# Patient Record
Sex: Male | Born: 1960 | Race: White | Hispanic: No | Smoking: Never smoker
Health system: Southern US, Community
[De-identification: ages and names within clinical notes are randomized; demographics above are authoritative.]

## PROBLEM LIST (undated history)

## (undated) DIAGNOSIS — I480 Paroxysmal atrial fibrillation: Secondary | ICD-10-CM

## (undated) DIAGNOSIS — I10 Essential (primary) hypertension: Secondary | ICD-10-CM

## (undated) DIAGNOSIS — E669 Obesity, unspecified: Secondary | ICD-10-CM

## (undated) DIAGNOSIS — N529 Male erectile dysfunction, unspecified: Secondary | ICD-10-CM

## (undated) DIAGNOSIS — B351 Tinea unguium: Secondary | ICD-10-CM

## (undated) DIAGNOSIS — G4733 Obstructive sleep apnea (adult) (pediatric): Secondary | ICD-10-CM

## (undated) DIAGNOSIS — R7303 Prediabetes: Secondary | ICD-10-CM

## (undated) DIAGNOSIS — D509 Iron deficiency anemia, unspecified: Secondary | ICD-10-CM

## (undated) DIAGNOSIS — K259 Gastric ulcer, unspecified as acute or chronic, without hemorrhage or perforation: Secondary | ICD-10-CM

## (undated) HISTORY — DX: Prediabetes: R73.03

## (undated) HISTORY — DX: Iron deficiency anemia, unspecified: D50.9

## (undated) HISTORY — DX: Tinea unguium: B35.1

## (undated) HISTORY — DX: Hemochromatosis, unspecified: E83.119

## (undated) HISTORY — DX: Male erectile dysfunction, unspecified: N52.9

## (undated) HISTORY — DX: Obstructive sleep apnea (adult) (pediatric): G47.33

---

## 2008-11-24 ENCOUNTER — Emergency Department (HOSPITAL_COMMUNITY): Admission: EM | Admit: 2008-11-24 | Discharge: 2008-11-24 | Payer: Self-pay | Admitting: Emergency Medicine

## 2009-02-20 ENCOUNTER — Ambulatory Visit (HOSPITAL_BASED_OUTPATIENT_CLINIC_OR_DEPARTMENT_OTHER): Admission: RE | Admit: 2009-02-20 | Discharge: 2009-02-21 | Payer: Self-pay | Admitting: Orthopedic Surgery

## 2010-08-29 HISTORY — PX: OTHER SURGICAL HISTORY: SHX169

## 2010-12-06 LAB — BASIC METABOLIC PANEL
CO2: 25 mEq/L (ref 19–32)
Calcium: 8.9 mg/dL (ref 8.4–10.5)
Chloride: 110 mEq/L (ref 96–112)
GFR calc non Af Amer: >60 mL/min (ref >60)
Glucose, Bld: 88 mg/dL (ref 70–99)
Potassium: 4.2 mEq/L (ref 3.5–5.1)

## 2010-12-06 LAB — POCT HEMOGLOBIN-HEMACUE: Hemoglobin: 18.5 g/dL — ABNORMAL HIGH (ref 13.0–17.0)

## 2011-01-11 NOTE — Op Note (Signed)
NAME:  Darren Alexander, Darren Alexander NO.:  [id number]   MEDICAL RECORD NO.:  [id number]          PATIENT TYPE:  AMB   LOCATION:  DSC                          FACILITY:  [facility]   PHYSICIAN:  Eulas Post, MD    DATE OF BIRTH:  05-Feb-1961   DATE OF PROCEDURE:  02/20/2009  DATE OF DISCHARGE:                               OPERATIVE REPORT   ATTENDING SURGEON:  Eulas Post, MD   FIRST ASSISTANT:  Skip Mayer, PA-C   PREOPERATIVE DIAGNOSIS:  Left midshaft humerus nonunion.   POSTOPERATIVE DIAGNOSIS:  Left midshaft humerus nonunion.   OPERATIVE PROCEDURE:  Open reduction and internal fixation, left  midshaft humerus.   ANESTHESIA:  General and also a regional block.   ESTIMATED BLOOD LOSS:  300 mL.   OPERATIVE IMPLANTS:  Synthes narrow 4.5 mm locking plate with a total of  two proximal locking screws, one distal locking screw, one proximal  nonlocking screw, and two distal nonlocking screws.  The screws were  size 4.0 mm.   PREOPERATIVE INDICATIONS:  Darren Alexander is a 50 year old man who  had a midshaft humerus fracture that was transverse in nature.  We tried  conservative treatment for a period of 3 months and had ongoing gross  motion with no evidence of healing clinically or radiographically.  He  was in Sarmiento fracture brace.  Due to the transverse nature, I  suspect that he did not have adequate cancellous bone exposed in order  to achieve union.  Therefore, he elected to undergo the above-named  procedures.  Preoperatively, had had abnormal hand function, with  inability to make a fist, and when he tried to flex his fingers, they  went into severe dysfunctional spasm.  The risks, benefits, and  alternatives were discussed at length preoperatively including but not  limited to risks of infection, bleeding, nerve injury, malunion,  nonunion, hardware failure, hardware prominence, need for hardware  removal, cardiopulmonary complications, swelling,  among others and he  was willing to proceed.   OPERATIVE PROCEDURE:  The patient was brought to the operating room and  placed in a supine position.  Regional block had already been  administered.  The left upper extremity was prepped and draped in the  usual sterile fashion after general anesthesia was administered.  Anterior approach to the midshaft humerus was carried out.  The cephalic  vein was retracted laterally.  The biceps was retracted medially.  The  deep layer of the brachialis was split.  Care was taken to protect the  neurovascular structures, including the radial nerve and the lateral  antebrachial cutaneous nerve during the approach.  Self retaining  retractor was placed.  We attempted to minimize the use of homan  retractors to minimize risk to the radial nerve.  The fracture site was  exposed and all of the callus removed.  The bone edges were freshened.  Both medullary canals were opened.  The plate was selected and applied  to the proximal segment and secured with a single screw.  We then  reduced the fracture and placed the plate onto the distal segment  and  had excellent compression and anatomic reduction.  We then secured the  plate using a nonlocking screw.  This was placed in compression type  technique.  We then secured the plate proximally and distally using a  combination of locking and nonlocking screws.  I used a nonlocking screw  in a slightly angled fashion at the first screw distal to the fracture  site in order to obtain purchase of both cortices given the fracture  configuration.  We then irrigated the wounds copiously and took final x-  rays and then closed the superficial fascia with Ethibond followed by 3-  0 subcutaneous Vicryl and 4-0 Monocryl and Steri-Strips for the skin.  The wounds were dressed with sterile gauze and the patient was awakened  and returned to PACU in stable and satisfactory condition.  There were  no complications and the  patient tolerated the procedure well.      Eulas Post, MD  Electronically Signed     JPL/MEDQ  D:  02/20/2009  T:  02/21/2009  Job:  [id number]

## 2012-08-29 DIAGNOSIS — R609 Edema, unspecified: Secondary | ICD-10-CM | POA: Insufficient documentation

## 2013-02-20 ENCOUNTER — Emergency Department (HOSPITAL_COMMUNITY): Payer: Worker's Compensation

## 2013-02-20 ENCOUNTER — Emergency Department (HOSPITAL_COMMUNITY): Payer: Worker's Compensation | Admitting: Anesthesiology

## 2013-02-20 ENCOUNTER — Encounter (HOSPITAL_COMMUNITY): Payer: Self-pay | Admitting: Anesthesiology

## 2013-02-20 ENCOUNTER — Encounter (HOSPITAL_COMMUNITY): Payer: Self-pay | Admitting: Emergency Medicine

## 2013-02-20 ENCOUNTER — Encounter (HOSPITAL_COMMUNITY)
Admission: EM | Disposition: A | Discharge status: Home / Self Care | Payer: Self-pay | Source: Home / Self Care | Attending: Emergency Medicine

## 2013-02-20 ENCOUNTER — Observation Stay (HOSPITAL_COMMUNITY)
Admission: EM | Admit: 2013-02-20 | Discharge: 2013-02-22 | Disposition: A | Discharge status: Home / Self Care | Payer: Worker's Compensation | Attending: Orthopedic Surgery | Admitting: Orthopedic Surgery

## 2013-02-20 DIAGNOSIS — W102XXA Fall (on)(from) incline, initial encounter: Secondary | ICD-10-CM

## 2013-02-20 DIAGNOSIS — I4819 Other persistent atrial fibrillation: Secondary | ICD-10-CM | POA: Diagnosis present

## 2013-02-20 DIAGNOSIS — Y990 Civilian activity done for income or pay: Secondary | ICD-10-CM | POA: Insufficient documentation

## 2013-02-20 DIAGNOSIS — D62 Acute posthemorrhagic anemia: Secondary | ICD-10-CM | POA: Insufficient documentation

## 2013-02-20 DIAGNOSIS — I10 Essential (primary) hypertension: Secondary | ICD-10-CM | POA: Insufficient documentation

## 2013-02-20 DIAGNOSIS — I4891 Unspecified atrial fibrillation: Secondary | ICD-10-CM | POA: Insufficient documentation

## 2013-02-20 DIAGNOSIS — S81809A Unspecified open wound, unspecified lower leg, initial encounter: Secondary | ICD-10-CM | POA: Insufficient documentation

## 2013-02-20 DIAGNOSIS — K259 Gastric ulcer, unspecified as acute or chronic, without hemorrhage or perforation: Secondary | ICD-10-CM | POA: Insufficient documentation

## 2013-02-20 DIAGNOSIS — I119 Hypertensive heart disease without heart failure: Secondary | ICD-10-CM | POA: Diagnosis present

## 2013-02-20 DIAGNOSIS — Z79899 Other long term (current) drug therapy: Secondary | ICD-10-CM | POA: Insufficient documentation

## 2013-02-20 DIAGNOSIS — S81009A Unspecified open wound, unspecified knee, initial encounter: Secondary | ICD-10-CM | POA: Insufficient documentation

## 2013-02-20 DIAGNOSIS — W11XXXA Fall on and from ladder, initial encounter: Secondary | ICD-10-CM | POA: Insufficient documentation

## 2013-02-20 DIAGNOSIS — S81812A Laceration without foreign body, left lower leg, initial encounter: Secondary | ICD-10-CM

## 2013-02-20 DIAGNOSIS — IMO0002 Reserved for concepts with insufficient information to code with codable children: Secondary | ICD-10-CM | POA: Insufficient documentation

## 2013-02-20 DIAGNOSIS — T07XXXA Unspecified multiple injuries, initial encounter: Secondary | ICD-10-CM

## 2013-02-20 DIAGNOSIS — Y9389 Activity, other specified: Secondary | ICD-10-CM | POA: Insufficient documentation

## 2013-02-20 HISTORY — DX: Essential (primary) hypertension: I10

## 2013-02-20 HISTORY — DX: Gastric ulcer, unspecified as acute or chronic, without hemorrhage or perforation: K25.9

## 2013-02-20 HISTORY — DX: Paroxysmal atrial fibrillation: I48.0

## 2013-02-20 HISTORY — PX: I & D EXTREMITY: SHX5045

## 2013-02-20 LAB — BASIC METABOLIC PANEL WITH GFR
BUN: 14 mg/dL (ref 6–23)
CO2: 21 meq/L (ref 19–32)
Calcium: 8.2 mg/dL — ABNORMAL LOW (ref 8.4–10.5)
Chloride: 105 meq/L (ref 96–112)
Creatinine, Ser: 0.85 mg/dL (ref 0.50–1.35)
GFR calc Af Amer: >90 mL/min
GFR calc non Af Amer: >90 mL/min
Glucose, Bld: 121 mg/dL — ABNORMAL HIGH (ref 70–99)
Potassium: 3.8 meq/L (ref 3.5–5.1)
Sodium: 137 meq/L (ref 135–145)

## 2013-02-20 LAB — TYPE AND SCREEN
ABO/RH(D): O POS
Antibody Screen: NEGATIVE

## 2013-02-20 LAB — CBC WITH DIFFERENTIAL/PLATELET
Eosinophils Absolute: 0.2 10*3/uL (ref 0.0–0.7)
Lymphs Abs: 1.9 10*3/uL (ref 0.7–4.0)
MCH: 22.8 pg — ABNORMAL LOW (ref 26.0–34.0)
Neutrophils Relative %: 71 % (ref 43–77)
Platelets: 294 10*3/uL (ref 150–400)
RBC: 5.58 MIL/uL (ref 4.22–5.81)
WBC: 11.8 10*3/uL — ABNORMAL HIGH (ref 4.0–10.5)

## 2013-02-20 LAB — HIV RAPID SCREEN (BLD OR BODY FLD EXPOSURE): Rapid HIV Screen: NONREACTIVE

## 2013-02-20 LAB — ABO/RH: ABO/RH(D): O POS

## 2013-02-20 SURGERY — IRRIGATION AND DEBRIDEMENT EXTREMITY
Anesthesia: General | Site: Leg Lower | Laterality: Bilateral | Wound class: Dirty or Infected

## 2013-02-20 MED ORDER — HYDROMORPHONE HCL PF 1 MG/ML IJ SOLN
1.0000 mg | Freq: Once | INTRAMUSCULAR | Status: AC
  Administered 2013-02-20: 1 mg via INTRAVENOUS
  Filled 2013-02-20: qty 1

## 2013-02-20 MED ORDER — FENTANYL CITRATE 0.05 MG/ML IJ SOLN
INTRAMUSCULAR | Status: DC | PRN
  Administered 2013-02-20: 50 ug via INTRAVENOUS
  Administered 2013-02-20 (×2): 100 ug via INTRAVENOUS

## 2013-02-20 MED ORDER — ARTIFICIAL TEARS OP OINT
TOPICAL_OINTMENT | OPHTHALMIC | Status: DC | PRN
  Administered 2013-02-20: 1 via OPHTHALMIC

## 2013-02-20 MED ORDER — ACETAMINOPHEN 325 MG PO TABS: 650.0000 mg | ORAL_TABLET | Freq: Four times a day (QID) | ORAL | Status: DC | PRN

## 2013-02-20 MED ORDER — LACTATED RINGERS IV SOLN
INTRAVENOUS | Status: DC | PRN
  Administered 2013-02-20: 17:00:00 via INTRAVENOUS

## 2013-02-20 MED ORDER — ONDANSETRON 4 MG PO TBDP: 4.0000 mg | ORAL_TABLET | Freq: Once | ORAL | Status: DC

## 2013-02-20 MED ORDER — ONDANSETRON HCL 4 MG/2ML IJ SOLN
4.0000 mg | Freq: Once | INTRAMUSCULAR | Status: AC
  Administered 2013-02-20: 4 mg via INTRAVENOUS
  Filled 2013-02-20: qty 2

## 2013-02-20 MED ORDER — SUCCINYLCHOLINE CHLORIDE 20 MG/ML IJ SOLN
INTRAMUSCULAR | Status: DC | PRN
  Administered 2013-02-20: 140 mg via INTRAVENOUS

## 2013-02-20 MED ORDER — ALUM & MAG HYDROXIDE-SIMETH 200-200-20 MG/5ML PO SUSP: 30.0000 mL | Freq: Four times a day (QID) | ORAL | Status: DC | PRN

## 2013-02-20 MED ORDER — HYDROMORPHONE HCL PF 1 MG/ML IJ SOLN
INTRAMUSCULAR | Status: DC | PRN
  Administered 2013-02-20 (×2): 0.5 mg via INTRAVENOUS

## 2013-02-20 MED ORDER — LACTATED RINGERS IV SOLN
INTRAVENOUS | Status: DC
  Administered 2013-02-21: 09:00:00 via INTRAVENOUS

## 2013-02-20 MED ORDER — ACETAMINOPHEN 650 MG RE SUPP: 650.0000 mg | Freq: Four times a day (QID) | RECTAL | Status: DC | PRN

## 2013-02-20 MED ORDER — ONDANSETRON HCL 4 MG/2ML IJ SOLN: 4.0000 mg | Freq: Four times a day (QID) | INTRAMUSCULAR | Status: DC | PRN

## 2013-02-20 MED ORDER — DOCUSATE SODIUM 100 MG PO CAPS
100.0000 mg | ORAL_CAPSULE | Freq: Two times a day (BID) | ORAL | Status: DC
  Administered 2013-02-20 – 2013-02-22 (×4): 100 mg via ORAL
  Filled 2013-02-20 (×6): qty 1

## 2013-02-20 MED ORDER — ENOXAPARIN SODIUM 40 MG/0.4ML ~~LOC~~ SOLN
40.0000 mg | SUBCUTANEOUS | Status: DC
  Administered 2013-02-20 – 2013-02-21 (×2): 40 mg via SUBCUTANEOUS
  Filled 2013-02-20 (×3): qty 0.4

## 2013-02-20 MED ORDER — OXYCODONE HCL 5 MG PO TABS
5.0000 mg | ORAL_TABLET | ORAL | Status: DC | PRN
  Administered 2013-02-20 – 2013-02-21 (×5): 10 mg via ORAL
  Filled 2013-02-20 (×5): qty 2

## 2013-02-20 MED ORDER — SODIUM CHLORIDE 0.9 % IV SOLN
Freq: Once | INTRAVENOUS | Status: AC
  Administered 2013-02-20: 16:00:00 via INTRAVENOUS

## 2013-02-20 MED ORDER — SODIUM CHLORIDE 0.9 % IR SOLN
Status: DC | PRN
  Administered 2013-02-20: 3000 mL

## 2013-02-20 MED ORDER — ETOMIDATE 2 MG/ML IV SOLN
INTRAVENOUS | Status: DC | PRN
  Administered 2013-02-20: 20 mg via INTRAVENOUS

## 2013-02-20 MED ORDER — HYDROMORPHONE HCL PF 1 MG/ML IJ SOLN
0.2500 mg | INTRAMUSCULAR | Status: DC | PRN
  Administered 2013-02-20 (×2): 0.5 mg via INTRAVENOUS

## 2013-02-20 MED ORDER — ONDANSETRON HCL 4 MG PO TABS: 4.0000 mg | ORAL_TABLET | Freq: Four times a day (QID) | ORAL | Status: DC | PRN

## 2013-02-20 MED ORDER — LIDOCAINE HCL (CARDIAC) 20 MG/ML IV SOLN
INTRAVENOUS | Status: DC | PRN
  Administered 2013-02-20: 90 mg via INTRAVENOUS

## 2013-02-20 MED ORDER — CEFAZOLIN SODIUM 1-5 GM-% IV SOLN
1.0000 g | Freq: Three times a day (TID) | INTRAVENOUS | Status: DC
  Administered 2013-02-20 – 2013-02-22 (×5): 1 g via INTRAVENOUS
  Filled 2013-02-20 (×7): qty 50

## 2013-02-20 MED ORDER — HYDROMORPHONE HCL PF 1 MG/ML IJ SOLN: 1.0000 mg | Freq: Once | INTRAMUSCULAR | Status: DC

## 2013-02-20 MED ORDER — OXYCODONE HCL 5 MG/5ML PO SOLN: 5.0000 mg | Freq: Once | ORAL | Status: DC | PRN

## 2013-02-20 MED ORDER — OXYCODONE HCL 5 MG PO TABS: 5.0000 mg | ORAL_TABLET | Freq: Once | ORAL | Status: DC | PRN

## 2013-02-20 MED ORDER — MORPHINE SULFATE 2 MG/ML IJ SOLN: 1.0000 mg | INTRAMUSCULAR | Status: DC | PRN

## 2013-02-20 MED ORDER — MIDAZOLAM HCL 5 MG/5ML IJ SOLN
INTRAMUSCULAR | Status: DC | PRN
  Administered 2013-02-20 (×2): 1 mg via INTRAVENOUS

## 2013-02-20 MED ORDER — CEFAZOLIN SODIUM 1-5 GM-% IV SOLN
1.0000 g | Freq: Once | INTRAVENOUS | Status: AC
  Administered 2013-02-20: 3 g via INTRAVENOUS
  Filled 2013-02-20: qty 50

## 2013-02-20 MED ORDER — HYDROMORPHONE HCL PF 1 MG/ML IJ SOLN
INTRAMUSCULAR | Status: AC
  Filled 2013-02-20: qty 1

## 2013-02-20 MED ORDER — PROPOFOL 10 MG/ML IV BOLUS
INTRAVENOUS | Status: DC | PRN
  Administered 2013-02-20: 60 mg via INTRAVENOUS

## 2013-02-20 MED ORDER — ONDANSETRON HCL 4 MG/2ML IJ SOLN
INTRAMUSCULAR | Status: DC | PRN
  Administered 2013-02-20: 4 mg via INTRAVENOUS

## 2013-02-20 SURGICAL SUPPLY — 44 items
BANDAGE ELASTIC 6 VELCRO ST LF (GAUZE/BANDAGES/DRESSINGS) ×2 IMPLANT
BANDAGE GAUZE ELAST BULKY 4 IN (GAUZE/BANDAGES/DRESSINGS) ×2 IMPLANT
BLADE SURG 10 STRL SS (BLADE) IMPLANT
BNDG COHESIVE 4X5 TAN STRL (GAUZE/BANDAGES/DRESSINGS) ×2 IMPLANT
BNDG GAUZE STRTCH 6 (GAUZE/BANDAGES/DRESSINGS) IMPLANT
BRUSH SCRUB DISP (MISCELLANEOUS) ×6 IMPLANT
CLOTH BEACON ORANGE TIMEOUT ST (SAFETY) ×2 IMPLANT
COVER SURGICAL LIGHT HANDLE (MISCELLANEOUS) ×4 IMPLANT
DRAPE C-ARMOR (DRAPES) IMPLANT
DRAPE U-SHAPE 47X51 STRL (DRAPES) ×2 IMPLANT
DRSG ADAPTIC 3X8 NADH LF (GAUZE/BANDAGES/DRESSINGS) ×2 IMPLANT
DRSG MEPITEL 4X7.2 (GAUZE/BANDAGES/DRESSINGS) ×4 IMPLANT
DRSG PAD ABDOMINAL 8X10 ST (GAUZE/BANDAGES/DRESSINGS) ×2 IMPLANT
ELECT CAUTERY BLADE 6.4 (BLADE) ×2 IMPLANT
ELECT REM PT RETURN 9FT ADLT (ELECTROSURGICAL) ×2
ELECTRODE REM PT RTRN 9FT ADLT (ELECTROSURGICAL) ×1 IMPLANT
GLOVE BIO SURGEON STRL SZ7.5 (GLOVE) ×2 IMPLANT
GLOVE BIO SURGEON STRL SZ8 (GLOVE) ×2 IMPLANT
GLOVE BIOGEL PI IND STRL 7.5 (GLOVE) ×1 IMPLANT
GLOVE BIOGEL PI IND STRL 8 (GLOVE) ×1 IMPLANT
GLOVE BIOGEL PI INDICATOR 7.5 (GLOVE) ×1
GLOVE BIOGEL PI INDICATOR 8 (GLOVE) ×1
GOWN PREVENTION PLUS XLARGE (GOWN DISPOSABLE) ×4 IMPLANT
GOWN STRL NON-REIN LRG LVL3 (GOWN DISPOSABLE) IMPLANT
HANDPIECE INTERPULSE COAX TIP (DISPOSABLE)
KIT BASIN OR (CUSTOM PROCEDURE TRAY) ×2 IMPLANT
KIT ROOM TURNOVER OR (KITS) ×2 IMPLANT
MANIFOLD NEPTUNE II (INSTRUMENTS) ×2 IMPLANT
NS IRRIG 1000ML POUR BTL (IV SOLUTION) ×2 IMPLANT
PACK ORTHO EXTREMITY (CUSTOM PROCEDURE TRAY) ×2 IMPLANT
PAD ARMBOARD 7.5X6 YLW CONV (MISCELLANEOUS) ×4 IMPLANT
PADDING CAST COTTON 6X4 STRL (CAST SUPPLIES) ×2 IMPLANT
SET HNDPC FAN SPRY TIP SCT (DISPOSABLE) IMPLANT
SPONGE GAUZE 4X4 12PLY (GAUZE/BANDAGES/DRESSINGS) ×4 IMPLANT
SPONGE LAP 18X18 X RAY DECT (DISPOSABLE) ×2 IMPLANT
STOCKINETTE IMPERVIOUS 9X36 MD (GAUZE/BANDAGES/DRESSINGS) ×2 IMPLANT
SUT PDS AB 2-0 CT1 27 (SUTURE) ×4 IMPLANT
TOWEL OR 17X24 6PK STRL BLUE (TOWEL DISPOSABLE) ×2 IMPLANT
TOWEL OR 17X26 10 PK STRL BLUE (TOWEL DISPOSABLE) ×4 IMPLANT
TUBE ANAEROBIC SPECIMEN COL (MISCELLANEOUS) IMPLANT
TUBE CONNECTING 12X1/4 (SUCTIONS) IMPLANT
UNDERPAD 30X30 INCONTINENT (UNDERPADS AND DIAPERS) ×4 IMPLANT
WATER STERILE IRR 1000ML POUR (IV SOLUTION) ×2 IMPLANT
YANKAUER SUCT BULB TIP NO VENT (SUCTIONS) IMPLANT

## 2013-02-20 NOTE — ED Notes (Signed)
Pt. Has an abrasion to his  Chin.  Skin tear to his rt. Posterior forearm , Bruising to his Rt. Upper bicep area Pt. Has skin tear to his lt. Forearm .  All abrasions  Have been cleansed and bacitracin applied by EMT, Healther

## 2013-02-20 NOTE — Anesthesia Procedure Notes (Signed)
Procedure Name: Intubation Date/Time: 02/20/2013 5:31 PM Performed by: Gayla Medicus Pre-anesthesia Checklist: Patient identified, Emergency Drugs available, Suction available, Patient being monitored and Timeout performed Patient Re-evaluated:Patient Re-evaluated prior to inductionOxygen Delivery Method: Circle system utilized Preoxygenation: Pre-oxygenation with 100% oxygen Intubation Type: IV induction, Rapid sequence and Cricoid Pressure applied Laryngoscope Size: Mac and 4 Grade View: Grade III Tube type: Oral Tube size: 7.5 mm Number of attempts: 1 Airway Equipment and Method: Stylet Placement Confirmation: ETT inserted through vocal cords under direct vision,  positive ETCO2 and breath sounds checked- equal and bilateral Secured at: 22 cm Tube secured with: Tape Dental Injury: Teeth and Oropharynx as per pre-operative assessment

## 2013-02-20 NOTE — ED Provider Notes (Signed)
Date: 02/20/2013  Rate: 85  Rhythm: atrial fibrillation,   QRS Axis: normal  Intervals: normal  ST/T Wave abnormalities: normal  Conduction Disutrbances:none  Narrative Interpretation:   Old EKG Reviewed: none available           Dorthula Matas, PA-C 02/20/13 1658

## 2013-02-20 NOTE — Brief Op Note (Signed)
02/20/2013  6:55 PM  PATIENT:  Darren Alexander  52 y.o. male  PRE-OPERATIVE DIAGNOSIS:  Left Calf Laceration, right calf puncture wound, right forearm wound, left hand wound  POST-OPERATIVE DIAGNOSIS:  Left Calf Laceration, right calf puncture wound, right forearm wound, left hand wound  PROCEDURE:  Procedure(s): Exploration and IRRIGATION AND DEBRIDEMENT of bilateral lower legs  (Bilateral)  SURGEON:  Surgeon(s) and Role:    * Budd Palmer, MD - Primary  PHYSICIAN ASSISTANT: Montez Morita, [facility]  ANESTHESIA:   general  EBL:  Total I/O In: 800 [I.V.:800] Out: 50 [Blood:50]  BLOOD ADMINISTERED:none  DRAINS: Penrose drain in the right and left calves (two each side)   LOCAL MEDICATIONS USED:  NONE  SPECIMEN:  No Specimen  DISPOSITION OF SPECIMEN:  N/A  COUNTS:  YES  TOURNIQUET:  * No tourniquets in log *  DICTATION: [id number]  PLAN OF CARE: Admit for overnight observation  PATIENT DISPOSITION:  PACU - hemodynamically stable.   Delay start of Pharmacological VTE agent (>24hrs) due to surgical blood loss or risk of bleeding: no

## 2013-02-20 NOTE — ED Provider Notes (Signed)
Medical screening examination/treatment/procedure(s) were conducted as a shared visit with non-physician practitioner(s) and myself.  I personally evaluated the patient during the encounter  Arterial bleeding from anterior L shin laceration with exposed muscle and fat.  Unable to visualize source. Uncontrolled with pressure. 4 figure of 8 sutures placed with slowing of active bleeding. Tourniquet placed for control while going to OR.  D/w Dr. Carola Frost.  LACERATION REPAIR Performed by: Glynn Octave Authorized by: Glynn Octave Consent: Verbal consent obtained. Risks and benefits: risks, benefits and alternatives were discussed Consent given by: patient Patient identity confirmed: provided demographic data Prepped and Draped in normal sterile fashion Wound explored  Laceration Location: L knee  Laceration Length: 10 cm  Anesthesia: local infiltration  Local anesthetic: lidocaine 1% with epinephrine  Anesthetic total: 10 ml  Skin closure: not closed  Number of sutures: 4  Technique: figure 8 sutures placed for control of pulsatile bleeding.  Patient tolerance: Patient tolerated the procedure well with no immediate complications.  CRITICAL CARE Performed by: Glynn Octave Total critical care time: 30 Critical care time was exclusive of separately billable procedures and treating other patients. Critical care was necessary to treat or prevent imminent or life-threatening deterioration. Critical care was time spent personally by me on the following activities: development of treatment plan with patient and/or surrogate as well as nursing, discussions with consultants, evaluation of patient's response to treatment, examination of patient, obtaining history from patient or surrogate, ordering and performing treatments and interventions, ordering and review of laboratory studies, ordering and review of radiographic studies, pulse oximetry and re-evaluation of patient's  condition.   Glynn Octave, MD 02/20/13 2043

## 2013-02-20 NOTE — ED Notes (Signed)
Report given to [facility] in the OR

## 2013-02-20 NOTE — ED Provider Notes (Signed)
Medical screening examination/treatment/procedure(s) were conducted as a shared visit with non-physician practitioner(s) and myself.  I personally evaluated the patient during the encounter   Glynn Octave, MD 02/20/13 2102

## 2013-02-20 NOTE — Transfer of Care (Signed)
Immediate Anesthesia Transfer of Care Note  Patient: Darren Alexander  Procedure(s) Performed: Procedure(s): IRRIGATION AND DEBRIDEMENT bilateral lower legs  (Bilateral)  Patient Location: PACU  Anesthesia Type:General  Level of Consciousness: awake, alert  and oriented  Airway & Oxygen Therapy: Patient Spontanous Breathing and Patient connected to nasal cannula oxygen  Post-op Assessment: Report given to PACU RN, Post -op Vital signs reviewed and stable and Patient moving all extremities X 4  Post vital signs: Reviewed and stable  Complications: No apparent anesthesia complications

## 2013-02-20 NOTE — Anesthesia Preprocedure Evaluation (Addendum)
Anesthesia Evaluation  Patient identified by MRN, date of birth, ID band Patient awake    Reviewed: Allergy & Precautions, H&P , NPO status , Patient's Chart, lab work & pertinent test results  Airway Mallampati: II TM Distance: >3 FB Neck ROM: full    Dental   Pulmonary          Cardiovascular hypertension, Pt. on medications + dysrhythmias Atrial Fibrillation     Neuro/Psych    GI/Hepatic GERD-  ,  Endo/Other    Renal/GU      Musculoskeletal   Abdominal   Peds  Hematology   Anesthesia Other Findings   Reproductive/Obstetrics                          Anesthesia Physical Anesthesia Plan  ASA: I and emergent  Anesthesia Plan: General   Post-op Pain Management:    Induction: Intravenous, Rapid sequence and Cricoid pressure planned  Airway Management Planned: Oral ETT  Additional Equipment:   Intra-op Plan:   Post-operative Plan: Extubation in OR  Informed Consent: I have reviewed the patients History and Physical, chart, labs and discussed the procedure including the risks, benefits and alternatives for the proposed anesthesia with the patient or authorized representative who has indicated his/her understanding and acceptance.     Plan Discussed with: CRNA, Anesthesiologist and Surgeon  Anesthesia Plan Comments:        Anesthesia Quick Evaluation

## 2013-02-20 NOTE — Anesthesia Postprocedure Evaluation (Signed)
  Anesthesia Post-op Note  Patient: Darren Alexander  Procedure(s) Performed: Procedure(s): IRRIGATION AND DEBRIDEMENT bilateral lower legs  (Bilateral)  Patient Location: PACU  Anesthesia Type:General  Level of Consciousness: awake, alert  and oriented  Airway and Oxygen Therapy: Patient Spontanous Breathing and Patient connected to nasal cannula oxygen  Post-op Pain: mild  Post-op Assessment: Post-op Vital signs reviewed  Post-op Vital Signs: Reviewed  Complications: No apparent anesthesia complications

## 2013-02-20 NOTE — ED Provider Notes (Signed)
Medical screening examination/treatment/procedure(s) were conducted as a shared visit with non-physician practitioner(s) and myself.  I personally evaluated the patient during the encounter  See my additional note  Glynn Octave, MD 02/20/13 2102

## 2013-02-20 NOTE — ED Notes (Signed)
Pt with fall on gravel today and hit left leg on trailor; pt with abrasion to chin, bilateral forearms, large laceration to left leg and abrasion to right leg

## 2013-02-20 NOTE — ED Notes (Signed)
Ancef given to the OR staff.

## 2013-02-20 NOTE — ED Provider Notes (Signed)
History    CSN: [id number] Arrival date & time 02/20/13  1432  First MD Initiated Contact with Patient 02/20/13 1451     Chief Complaint  Patient presents with  . Laceration   (Consider location/radiation/quality/duration/timing/severity/associated sxs/prior Treatment) HPI  Darren Alexander is a 52 y.o.male presents to the ER with complaints of fall from 2 feet and obtaining several abrasions and lacerations. He was climbing his ladder to get ontop of a trip trailer when from a two foot height, the ladder slept out from under him, his left shin hit the tailgate and he fell forward onto the gravel. He denies head injury, aside from an abrasion on his chin. Skin tears to bilateral arms and lacerations to bilateral shins. He had no loc, no neck or head pain, no vomiting or confusion after the incident. NO back, pelvic or abdominal pains. He says he is in moderate pain and will accept pain medication. Pt says he was on job when this happened. denies being on blood thinners  History reviewed. No pertinent past medical history. History reviewed. No pertinent past surgical history. History reviewed. No pertinent family history. History  Substance Use Topics  . Smoking status: Never Smoker   . Smokeless tobacco: Not on file  . Alcohol Use: No    Review of Systems  Musculoskeletal: Positive for joint swelling and arthralgias.  Skin: Positive for wound.  All other systems reviewed and are negative.    Allergies  Review of patient's allergies indicates no known allergies.  Home Medications  No current outpatient prescriptions on file. BP 128/65  Pulse 73  Temp(Src) 98 F (36.7 C) (Oral)  Resp 20  SpO2 95% Physical Exam  Nursing note and vitals reviewed. Constitutional: He appears well-developed and well-nourished. No distress.  HENT:  Head: Normocephalic. Head is with abrasion and with contusion. Head is without right periorbital erythema and without left periorbital erythema.     Eyes: Pupils are equal, round, and reactive to light.  Neck: Normal range of motion. Neck supple. No spinous process tenderness and no muscular tenderness present. No erythema and normal range of motion present.  Cardiovascular: Normal rate and regular rhythm.   Pulmonary/Chest: Effort normal.  Abdominal: Soft. Bowel sounds are normal. He exhibits no shifting dullness, no distension and no fluid wave. There is no hepatosplenomegaly, splenomegaly or hepatomegaly. There is no tenderness. There is no CVA tenderness.  Musculoskeletal:       Right wrist: Normal.       Left wrist: Normal.       Left knee: He exhibits swelling, ecchymosis and laceration. He exhibits normal range of motion, no effusion, no deformity, no erythema, normal alignment, no LCL laxity and normal patellar mobility. No tenderness found.       Cervical back: Normal.       Thoracic back: Normal.       Lumbar back: Normal.       Arms:      Legs: Patients joints evaluate thoroughly for strength and ROM. No deficiencies. Pain is mild. Pedal pulses are strong and symmetrical. Wounds edges are clean and not actively bleeding.  Neurological: He is alert.  Skin: Skin is warm and dry.    ED Course  Procedures (including critical care time) Labs Reviewed  CBC WITH DIFFERENTIAL  BASIC METABOLIC PANEL   Dg Tibia/fibula Left  02/20/2013   *RADIOLOGY REPORT*  Clinical Data: Fall.  Pain in the region of the left tib-fib proximally.  LEFT TIBIA AND FIBULA -  2 VIEW  Comparison: None.  Findings: Two-view exam shows no evidence for fracture.  No worrisome lytic or sclerotic osseous abnormality.  Focal soft tissue swelling in the anterior aspect of the proximal leg suggest contusion and there may be some associated laceration.  A tiny radiopaque foreign body is identified in the anterolateral soft tissues of the proximal leg.  IMPRESSION: No acute bony findings.  Radiopaque foreign body in the anterolateral soft tissues of the proximal  leg.   Original Report Authenticated By: Kennith Center, M.D.   Dg Humerus Left  02/20/2013   *RADIOLOGY REPORT*  Clinical Data: Fall.  Pain in the mid left humerus.  LEFT HUMERUS - 2+ VIEW  Comparison: Spot fluoro films from 02/20/2009  Findings: The patient is status post ORIF for mid humerus fracture. The fracture is healed.  There are no hardware complications.  No other acute fracture of the left humerus.  No worrisome lytic or sclerotic osseous abnormality.  IMPRESSION: Status post ORIF for remote fracture of the mid left humerus.  No acute or complicating features on today's study.   Original Report Authenticated By: Kennith Center, M.D.   1. Laceration of lower extremity, left, initial encounter   2. Fall (on)(from) incline, initial encounter   3. Multiple abrasions     MDM  Xrays ordered, pain medication ordered.  LACERATION REPAIR Performed by: Dorthula Matas Authorized by: Dorthula Matas Consent: Verbal consent obtained. Risks and benefits: risks, benefits and alternatives were discussed Consent given by: patient Patient identity confirmed: provided demographic data Prepped and Draped in normal sterile fashion Wound explored  Laceration Location: right mid tibia  Laceration Length: 2 cm  No Foreign Bodies seen or palpated  Anesthesia: local infiltration  Local anesthetic: lidocaine 2% with epinephrine  Anesthetic total: 2 ml  Irrigation method: syringe Amount of cleaning: standard  Skin closure: sutures  Number of sutures: 3  Technique: simple interrupted  Patient tolerance: Patient tolerated the procedure well with no immediate complications.    After return from xray the patients wound began to bleed significantly which appears to be arterial because it is pulsating. We are unable to evaluate how deep it is and xray shows foreign body. Dr. Manus Gunning feels that ortho may need to repair in the OR. Patient last ate around 1pm.   Discussed case with DR.  Handy, he will come see patient and repair laceration. Requests I start 1 gram of Ancef.   Dorthula Matas, PA-C 02/20/13 1612  Dorthula Matas, PA-C 02/20/13 1635

## 2013-02-20 NOTE — H&P (Signed)
[facility] H&P  Reason: complex laceration L proximal lower leg Requesting: S. Rancour, MD (EDP)  HPI  52 y/o white male was working on a trailer when he fell off a ladder several feet. The ladder was on gravel which gave way and pt hit his L leg on the trailer causing a significant laceration to the L lower Leg.  Pt was brought to [facility] ED for evaluation.  Attempt was made at closure in the ED but pt was bleeding profusely.  Ortho contacted regarding management.  It is also noted that the pt is now in A-fib.  State he had a hx of A-fib when he had a bleeding ulcer but is o/w in NSR.  Pt does not take any meds for afib. Denies chest pain or palpitations.  Denies numbness or tingling in his legs.  Pt did sustain additional abrasions to his R shin, and B arms but these are superficial.  Denies hitting head or blacking out.    Denies any hx of bleeding issues.  Does not take aspirin or any other blood thinners    States he has had his tetanus in the last 3 months   Last meal around 1300  PMH    Gastric Ulcer- cared for at [facility]   ? HTN (pt states he is on fluid pill and coreg)  Surg Hx    ORIF Left humerus- 2012  Allergies    NKDA  Fam Hx      Noncontributory   Soc Hx     Does not smoke     Does not drink  Meds        "Fluid pill"     Coreg  Review of Systems  Constitutional: Negative for fever and chills.  Eyes: Negative for blurred vision.  Respiratory: Negative for shortness of breath and wheezing.   Cardiovascular: Negative for chest pain.  Gastrointestinal: Negative for nausea, vomiting and abdominal pain.  Musculoskeletal:       Left leg pain   Neurological: Negative for tingling, sensory change and headaches.  Endo/Heme/Allergies: Does not bruise/bleed easily.     Exam  BP 128/65  Pulse 73  Temp(Src) 98 F (36.7 C) (Oral)  Resp 20  SpO2 95%   Physical Exam  Constitutional: Vital signs are normal. He appears well-developed and  well-nourished. He is cooperative. No distress.  HENT:  Head: Normocephalic and atraumatic.  Mouth/Throat: Oropharynx is clear and moist and mucous membranes are normal.  Eyes: EOM are normal.  Neck: Normal range of motion and full passive range of motion without pain. Neck supple. No spinous process tenderness and no muscular tenderness present.  Cardiovascular:  Irregularly irregular   Pulmonary/Chest:  Clear B   Abdominal:  Soft, + BS, NT  Musculoskeletal:  Left Lower extremity    Pt has a BP cuff on his L thigh acting as a tourniquet     Large laceration to the anterolateral Left leg   Wound extends deep to adipose and muscle  Not bleeding at current time due to tourniquet    DPN, SPN, TN sensation intact   + DP pulse   EHL, FHL, AT, PT, peroneals, gastroc motor intact   Compartments soft and NT   No pain with passive stretch    nontender to knee and ankle   Remainder of exam is unremarkable except for several abrasions, cuts that have been dressed in ED on R leg and B arms        Neurological: He  is alert.    XRAYS  L tibia     No acute fx   + FB  Assessment and Plan  52 y/o white male s/p fall with complex L leg laceration and A-fib  1. Complex laceration to L lower leg  To OR for formal I&D   Plan to close wound primarily   ABX to be administered  Pt up to date on Tetanus  2. A-fib  Possibly related to blood loss  Will monitor  Possibly keep over night  If we keep overnight will place on anticogulation   3. Dispo  OR for I&D L leg   Mearl Latin, PA-C [facility] [phone] (P) 02/20/2013 5:33 PM

## 2013-02-20 NOTE — Preoperative (Signed)
Beta Blockers   Reason not to administer Beta Blockers:Not Applicable 

## 2013-02-21 ENCOUNTER — Encounter (HOSPITAL_COMMUNITY): Payer: Self-pay | Admitting: Physician Assistant

## 2013-02-21 DIAGNOSIS — S81812A Laceration without foreign body, left lower leg, initial encounter: Secondary | ICD-10-CM

## 2013-02-21 DIAGNOSIS — I4891 Unspecified atrial fibrillation: Secondary | ICD-10-CM

## 2013-02-21 DIAGNOSIS — I4819 Other persistent atrial fibrillation: Secondary | ICD-10-CM | POA: Diagnosis present

## 2013-02-21 LAB — CBC
HCT: 36.4 % — ABNORMAL LOW (ref 39.0–52.0)
Hemoglobin: 10.9 g/dL — ABNORMAL LOW (ref 13.0–17.0)
MCHC: 29.9 g/dL — ABNORMAL LOW (ref 30.0–36.0)
MCV: 74.6 fL — ABNORMAL LOW (ref 78.0–100.0)
RDW: 21.3 % — ABNORMAL HIGH (ref 11.5–15.5)

## 2013-02-21 MED ORDER — DILTIAZEM HCL ER COATED BEADS 240 MG PO CP24
240.0000 mg | ORAL_CAPSULE | Freq: Every day | ORAL | Status: DC
  Administered 2013-02-21 – 2013-02-22 (×2): 240 mg via ORAL
  Filled 2013-02-21 (×2): qty 1

## 2013-02-21 MED ORDER — CARVEDILOL 12.5 MG PO TABS
12.5000 mg | ORAL_TABLET | Freq: Two times a day (BID) | ORAL | Status: DC
  Administered 2013-02-21 – 2013-02-22 (×2): 12.5 mg via ORAL
  Filled 2013-02-21 (×4): qty 1

## 2013-02-21 NOTE — Evaluation (Signed)
Physical Therapy Evaluation/ Discharge Patient Details Name: Darren Alexander MRN: [id number] DOB: 06-Oct-1960 Today's Date: 02/21/2013 Time: 4098-1191 PT Time Calculation (min): 19 min  PT Assessment / Plan / Recommendation History of Present Illness  52 y/o white male was working on a trailer when he fell off a ladder several feet. The ladder was on gravel which gave way and pt hit his L leg on the trailer causing a significant laceration to the L lower Leg. Pt with bil calf I&D, right forearm and left  hand wounds  Clinical Impression  Pt mobilizing well despite limited pain of LLE. Pt with decreased dorsiflexion and educated for and performed seated heel raises as well as education for gait. Both dgtrs present and able to assist at home. At this time all needs/education addressed and no further therapy warranted, pt aware and agreeable.    PT Assessment  Patent does not need any further PT services    Follow Up Recommendations  No PT follow up    Does the patient have the potential to tolerate intense rehabilitation      Barriers to Discharge        Equipment Recommendations  None recommended by PT    Recommendations for Other Services     Frequency      Precautions / Restrictions Precautions Precautions: None Restrictions Weight Bearing Restrictions: No   Pertinent Vitals/Pain 2/10 RLE ache, premedicated      Mobility  Bed Mobility Bed Mobility: Supine to Sit;Sitting - Scoot to Edge of Bed Supine to Sit: 6: Modified independent (Device/Increase time);HOB flat Sitting - Scoot to Edge of Bed: 6: Modified independent (Device/Increase time) Transfers Transfers: Sit to Stand;Stand to Sit Sit to Stand: 5: Supervision;From bed Stand to Sit: 5: Supervision;To chair/3-in-1 Ambulation/Gait Ambulation/Gait Assistance: 6: Modified independent (Device/Increase time) Ambulation Distance (Feet): 400 Feet Assistive device: None Gait Pattern: Step-through pattern;Decreased  dorsiflexion - right;Decreased dorsiflexion - left Gait velocity: WFL General Gait Details: Pt with decreased Dorsiflexion due to pain and educated for correction of gait and attention to  heel strike to normalize gait Stairs: Yes Stairs Assistance: 6: Modified independent (Device/Increase time) Stair Management Technique: Two rails;Step to pattern Number of Stairs: 2    Exercises     PT Diagnosis:    PT Problem List:   PT Treatment Interventions:       PT Goals(Current goals can be found in the care plan section) Acute Rehab PT Goals Patient Stated Goal: get back home and to work  Visit Information  Last PT Received On: 02/21/13 Assistance Needed: +1 History of Present Illness: 52 y/o white male was working on a trailer when he fell off a ladder several feet. The ladder was on gravel which gave way and pt hit his L leg on the trailer causing a significant laceration to the L lower Leg. Pt with bil calf I&D, right forearm and left  hand wounds       Prior Functioning  Home Living Family/patient expects to be discharged to:: Private residence Living Arrangements: Children Available Help at Discharge: Family;Available 24 hours/day Type of Home: Mobile home Home Access: Stairs to enter Entrance Stairs-Number of Steps: 2 Entrance Stairs-Rails: Left;Can reach both;Right Home Layout: One level Home Equipment: None Prior Function Level of Independence: Independent Communication Communication: No difficulties    Cognition  Cognition Arousal/Alertness: Awake/alert Behavior During Therapy: WFL for tasks assessed/performed Overall Cognitive Status: Within Functional Limits for tasks assessed    Extremity/Trunk Assessment Upper Extremity Assessment Upper Extremity Assessment:  Overall [facility] for tasks assessed Lower Extremity Assessment Lower Extremity Assessment: Overall WFL for tasks assessed Cervical / Trunk Assessment Cervical / Trunk Assessment: Normal   Balance    End of  Session PT - End of Session Equipment Utilized During Treatment: Gait belt Activity Tolerance: Patient tolerated treatment well Patient left: in chair;with call bell/phone within reach;with family/visitor present  GP     Toney Sang Beth 02/21/2013, 2:42 PM Delaney Meigs, PT [phone]

## 2013-02-21 NOTE — Progress Notes (Signed)
Orthopaedic [facility] Progress Note     1 Day Post-Op  Subjective   Doing ok this am  Sore B LEx  Denies any CP or palpitations. No SOB  Pts bleeding ulcer occurred in April of 2014, he was treated at [facility]  It was during this incident that he went into A-fib  He has seen Dr. Bing Matter ([facility]) in [place].  Spoke with nurse this am (Dr. Kirtland Bouchard is on vacation this week) who stated that pt was in NSR at the time of his last visit, end of April.    Objective  BP 127/77  Pulse 91  Temp(Src) 98.3 F (36.8 C) (Oral)  Resp 18  SpO2 98%  Patient Vitals for the past 24 hrs:  BP Temp Temp src Pulse Resp SpO2  02/21/13 0601 127/77 mmHg 98.3 F (36.8 C) Oral 91 18 98 %  02/20/13 2045 135/76 mmHg 97.5 F (36.4 C) Oral 96 18 97 %  02/20/13 1945 129/95 mmHg 97.4 F (36.3 C) - 82 18 98 %  02/20/13 1930 136/84 mmHg - - 76 15 100 %  02/20/13 1915 135/96 mmHg - - 77 15 100 %  02/20/13 1900 133/65 mmHg - - 68 17 99 %  02/20/13 1854 - 98.4 F (36.9 C) - - - -  02/20/13 1434 128/65 mmHg 98 F (36.7 C) Oral 73 20 95 %    Intake/Output     06/25 0701 - 06/26 0700 06/26 0701 - 06/27 0700   I.V. 800    Total Intake 800     Urine 500    Blood 50    Total Output 550     Net +250          Urine Occurrence 1 x      Tele: a-fib  Labs Results for JACQUES, FIFE (MRN [id number]) as of 02/21/2013 08:49  Ref. Range 02/21/2013 04:40  WBC Latest Range: 4.0-10.5 K/uL 14.4 (H)  RBC Latest Range: 4.22-5.81 MIL/uL 4.88  Hemoglobin Latest Range: 13.0-17.0 g/dL 78.2 (L)  HCT Latest Range: 39.0-52.0 % 36.4 (L)  MCV Latest Range: 78.0-100.0 fL 74.6 (L)  MCH Latest Range: 26.0-34.0 pg 22.3 (L)  MCHC Latest Range: 30.0-36.0 g/dL 95.6 (L)  RDW Latest Range: 11.5-15.5 % 21.3 (H)  Platelets Latest Range: 150-400 K/uL 270    Exam  Gen: awake and alert, NAD, appears very comfortable Lungs: clear B  Cardiac: irregularly irregular Abd: soft, NT, +BS Ext:      B Lower  Extremities  Dressings c/d/i  Distal motor and sensory functions intact  exts are warm  + DP pulses noted  Swelling stable  Compartments soft and NT, No DCT  Assessment and Plan  1 Day Post-Op  52 y/o male s/p fall of ladder and trailer  1. Fall 2. Complex lacerations to B LEx s/p I&D  WBAT  ROM as tolerated  Dressing change tomorrow vs sat  D/c penrose drains at time of dressing change  Continue with ice and elevation  3. A-fib  Spoke with cardiology here and they will consult  Pt w/o current cardiac symptoms   Appreciate consult  Pt will likely need follow up with his cardiologist in the next week or so  4. ABL anemia:  Mild  5. Pain management:  Continue with current management   6. DVT/PE prophylaxis:  Lovenox  ? If send home on anticoagulation if pt remains in afib  7. ID:   Continue ancef x 24 hours  8. Activity:  WBAT B  OOB ad lib  9. FEN/Foley/Lines:  Continue with IVF  10. Dispo:  Cards consult  D/c home once ok from cards perspective     Mearl Latin, PA-C [facility] [phone] (P) 02/21/2013 8:47 AM

## 2013-02-21 NOTE — Consult Note (Signed)
CARDIOLOGY CONSULT NOTE   Patient ID: Darren Alexander MRN: [id number] DOB/AGE: 1961/02/02 52 y.o.  Admit date: 02/20/2013  Primary Physician   Dina Rich, MD Primary Cardiologist   Dr Bing Matter, Rosalita Levan Reason for Consultation   Atrial fib  Darren Alexander is a 52 y.o. male with a history of PAF and HTN. Patients initial episode of afib was discovered in April of 2014 during hospitalization at [facility] for a bleeding ulcer. He is not on anticoagulation. His home meds include diltiazem (240 mg), digoxin (0.125 mg), coreg (25 mg) and lasix (40 mg).  He also has amlodipine-benazepril (10-20 mg) but patient reports he is not taking this medication since, although of note, it has been filled regularly at the pharmacy. Per ortho note, he has seen Dr. Ledora Bottcher with [facility] in  [place] at the end of April and was in NSR.  Yesterday, patient fell off a ladder while working on a trailer. He sustained a significant laceration to the left lower leg. He was brought to the [facility] where he was found to be in a fib. An attempt was made at closure in the ED but bleeding persisted.   Ortho was consulted for closure in the OR. Pt states he was presyncopal while bleeding was not controlled but has not experienced any symptoms since. No chest pain, palpitations, dyspnea, nausea, vomiting or syncope. HR has been in the 1 teens- 120's. He has not had any of his cardiac medications today.  We were consulted for management of atrial fib. Past Medical History  Diagnosis Date  . HTN (hypertension)   . Gastric ulcer   . PAF (paroxysmal atrial fibrillation)      Past Surgical History  Procedure Laterality Date  . Orif left humerus  2012    No Known Allergies  I have reviewed the patient's current medications . carvedilol  12.5 mg Oral BID WC  .  ceFAZolin (ANCEF) IV  1 g Intravenous Q8H  . diltiazem  240 mg Oral Daily  . docusate sodium  100 mg Oral BID  . enoxaparin  (LOVENOX) injection  40 mg Subcutaneous Q24H   . lactated ringers 50 mL/hr at 02/21/13 0859   acetaminophen, acetaminophen, alum & mag hydroxide-simeth, morphine injection, ondansetron (ZOFRAN) IV, ondansetron, oxyCODONE  Prior to Admission medications   Medication Sig Start Date End Date Taking? Authorizing Provider  amLODipine-benazepril (LOTREL) 10-20 MG per capsule Take 1 capsule by mouth daily.   Yes Historical Provider, MD  carvedilol (COREG) 25 MG tablet Take 25 mg by mouth 2 (two) times daily with a meal.   Yes Historical Provider, MD  digoxin (LANOXIN) 0.125 MG tablet Take 0.125 mg by mouth daily.   Yes Historical Provider, MD  diltiazem (DILACOR XR) 240 MG 24 hr capsule Take 240 mg by mouth daily.   Yes Historical Provider, MD  furosemide (LASIX) 40 MG tablet Take 40 mg by mouth daily.   Yes Historical Provider, MD  pantoprazole (PROTONIX) 40 MG tablet Take 40 mg by mouth daily.   Yes Historical Provider, MD  potassium chloride (K-DUR,KLOR-CON) 10 MEQ tablet Take 10 mEq by mouth daily.   Yes Historical Provider, MD  tadalafil (CIALIS) 10 MG tablet Take 10 mg by mouth daily as needed for erectile dysfunction.   Yes Historical Provider, MD  zolpidem (AMBIEN) 5 MG tablet Take 5 mg by mouth at bedtime as needed for sleep.   Yes Historical Provider, MD     History   Social History  .  Marital Status: Divorced    Spouse Name: N/A    Number of Children: N/A  . Years of Education: N/A   Occupational History  . Not on file.   Social History Main Topics  . Smoking status: Never Smoker   . Smokeless tobacco: Not on file  . Alcohol Use: No  . Drug Use: No  . Sexually Active: Not on file   Other Topics Concern  . Not on file   Social History Narrative  . No narrative on file    No family status information on file.   History reviewed. No pertinent family history.   ROS:  Full 14 point review of systems complete and found to be negative unless listed above.  Physical  Exam: Blood pressure 124/59, pulse 104, temperature 99 F (37.2 C), temperature source Oral, resp. rate 18, SpO2 100.00%.  General: Well developed, well nourished, male in no acute distress Head: Eyes PERRLA, No xanthomas.   Normocephalic and atraumatic, oropharynx without edema or exudate. Dentition: good Lungs: CTA throughout, no crackles or rhonchi Heart:  Heart irregular rate and rhythm with S1, S2 no murmur, rub or gallop. pulses are 2+ all 4 extrem.   Neck: No carotid bruits. No lymphadenopathy.  JVD not elevated. Abdomen: Bowel sounds present, abdomen soft and non-tender without masses or hernias noted. Msk:  No spine or cva tenderness. No weakness, no joint deformities or effusions. Extremities: No clubbing or cyanosis. Wrapped with ace bandages. Neuro: Alert and oriented X 3. No focal deficits noted. Psych:  Good affect, responds appropriately Skin: No rashes or lesions noted. Incision bandaged and dressed, not disturbed  Labs:   Lab Results  Component Value Date   WBC 14.4* 02/21/2013   HGB 10.9* 02/21/2013   HCT 36.4* 02/21/2013   MCV 74.6* 02/21/2013   PLT 270 02/21/2013    Recent Labs Lab 02/20/13 1611  NA 137  K 3.8  CL 105  CO2 21  BUN 14  CREATININE 0.85  CALCIUM 8.2*  GLUCOSE 121*   Echo:  Done at [facility] in April 2014:  EF 60-65%, LA and RA normal size and function, aortic valve trileaflet but no stenosis or regurg, LV mild concetnric LV hypertrophy.  ECG:  Atrial fibrillation, 80's, no acute ischemic changes.  Radiology:  Dg Tibia/fibula Left  02/20/2013   *RADIOLOGY REPORT*  Clinical Data: Irrigation and debridement.  DG C-ARM 1-60 MIN,LEFT TIBIA AND FIBULA - 2 VIEW  Comparison: 02/20/2013  Findings: Radiopaque foreign body in the soft tissues of the mid to lower leg.  This is localized in the surgical instrument.  No acute bony change.  IMPRESSION: Small radiopaque foreign body in the soft tissues is marked with the tip of a surgical clamp.    Original Report Authenticated By: Janeece Riggers, M.D.   Dg Tibia/fibula Left  02/20/2013   *RADIOLOGY REPORT*  Clinical Data: Fall.  Pain in the region of the left tib-fib proximally.  LEFT TIBIA AND FIBULA - 2 VIEW  Comparison: None.  Findings: Two-view exam shows no evidence for fracture.  No worrisome lytic or sclerotic osseous abnormality.  Focal soft tissue swelling in the anterior aspect of the proximal leg suggest contusion and there may be some associated laceration.  A tiny radiopaque foreign body is identified in the anterolateral soft tissues of the proximal leg.  IMPRESSION: No acute bony findings.  Radiopaque foreign body in the anterolateral soft tissues of the proximal leg.   Original Report Authenticated By: Kennith Center, M.D.  Dg Humerus Left  02/20/2013   *RADIOLOGY REPORT*  Clinical Data: Fall.  Pain in the mid left humerus.  LEFT HUMERUS - 2+ VIEW  Comparison: Spot fluoro films from 02/20/2009  Findings: The patient is status post ORIF for mid humerus fracture. The fracture is healed.  There are no hardware complications.  No other acute fracture of the left humerus.  No worrisome lytic or sclerotic osseous abnormality.  IMPRESSION: Status post ORIF for remote fracture of the mid left humerus.  No acute or complicating features on today's study.   Original Report Authenticated By: Kennith Center, M.D.   Dg C-arm 1-60 Min  02/20/2013   *RADIOLOGY REPORT*  Clinical Data: Irrigation and debridement.  DG C-ARM 1-60 MIN,LEFT TIBIA AND FIBULA - 2 VIEW  Comparison: 02/20/2013  Findings: Radiopaque foreign body in the soft tissues of the mid to lower leg.  This is localized in the surgical instrument.  No acute bony change.  IMPRESSION: Small radiopaque foreign body in the soft tissues is marked with the tip of a surgical clamp.   Original Report Authenticated By: Janeece Riggers, M.D.    ASSESSMENT AND PLAN:   The patient was seen today by Dr. Daleen Squibb, the patient evaluated and the data reviewed.   Principal Problem:   Laceration of left lower leg Active Problems:   PAF (paroxysmal atrial fibrillation) with rapid ventricular response  1. Paroxysmal Atrial Fibrillation. Resume home meds, including his PO diltiazem xr, at his home dose of 240 mg and Coreg at 12.5mg  (as patient states they halved his 25 mg dose because his BP was low). Stop patients home medication of digoxin.   He is a CHA2DS2VASc score of 1 which is a 1.3% stroke risk, so would recommend antiplatelet therapy with ASA.     SignedTheodore Demark, PA-C 02/21/2013 2:38 PM Beeper [phone]  Co-Sign MD  I have taken a history, reviewed medications, allergies, PMH, SH, FH, and reviewed ROS and examined the patient.  I agree with the assessment and plan. We'll discontinue digoxin with normal systolic function. Patient given diltiazem as well as his carvedilol for discharge. Followup with his cardiologist in [place].  Daegen Berrocal C. Daleen Squibb, MD, [facility] Pager:  [phone]

## 2013-02-21 NOTE — Op Note (Signed)
NAMEJAWAD, Darren Alexander NO.:  [id number]  MEDICAL RECORD NO.:  [id number]  LOCATION:  5N23C                        FACILITY:  [facility]  PHYSICIAN:  Doralee Albino. Carola Frost, M.D. DATE OF BIRTH:  11/21/1960  DATE OF PROCEDURE:  02/20/2013 DATE OF DISCHARGE:                              OPERATIVE REPORT   PREOPERATIVE DIAGNOSES: 1. Left large calf laceration with uncontrolled bleeding. 2. Right calf puncture wound. 3. Right forearm wound, skin tear. 4. Left hand wound.  POSTOPERATIVE DIAGNOSES: 1. Left large calf laceration with uncontrolled bleeding. 2. Right calf puncture wound. 3. Right forearm wound, skin tear. 4. Left hand wound.  PROCEDURES: 1. Exploration and irrigation and debridement of the left calf. 2. Exploration and debridement of the right calf, with extensive undermining. 3. Superficial debridement of skin, right forearm and cleaning,     dressing change of left hand.  ASSISTANT:  Mearl Latin, PA  ANESTHESIA:  General.  COMPLICATIONS:  None.  TOURNIQUET:  None.  I/O:  800 mL of IV fluids.  EBL:  50.  SPECIMENS:  None.  DRAINS:  For quarter-inch Penrose drains.  DISPOSITION:  To PACU.  CONDITION:  Stable.  BRIEF SUMMARY AND INDICATION FOR PROCEDURE:  Darren Alexander is a 52- year-old male, status post ORIF of left humerus some time ago by Dr. Dion Saucier who was working on a truck when he fell off a trailer, sustaining significant wounds to his legs bilaterally as well as his forearm and hand.  The ER was unable to control the bleeding and applied a tourniquet to the left side.  We were consulted for further evaluation and management.  We did discuss with him the risks and benefits of the surgery including the possibility of failure to prevent infection, nerve injury, vessel injury, DVT, PE, heart attack, stroke, particularly given his AFib, need for further surgery.  We also discussed a small metallic fragment which may or may not have  been secondary to his injury today.  The patient understood this and did wish to proceed.  BRIEF DESCRIPTION OF PROCEDURE:  Darren Alexander was given 3 g of Ancef preoperatively, taken to the operating room and general anesthesia was induced.  His right forearm was scrubbed with chlorhexidine scrub brush and some of the devitalized epidermis removed, the remainder was placed down as a biologic dressing Mepitel applied and Kerlix.  On the left, a thorough hand scrubbing performed, we had loss of the epidermal layer and Mepitel and gauze dressing applied.  Attention was then turned to the left calf where standard prep and drape was performed.  We then explored the wound trimming the skin edges and debriding subcu tissue, looking for metallic fragment and then using the C-arm to assist.  He did not have a violation of his anterior compartment and the bleeding was subcutaneous.  A C-arm was brought in, and we confirmed that this metallic fragment was removed from the area of his current wound by using orthogonal views.  None of these showed a close proximity between the wound in the fragment by laying a metal tonsil clamp along the extent of the soft tissue dissection and then using again C-arm to assure the distance.  At  least an inch deep to the wound.  Layered closure was performed after placing to deep quarter inch Penrose using 2- 0 PDS, and 3-0 nylon.  Attention turned to the right side where the contused skin edges were ellipsed resulting in a 3-cm wound the contralateral side just over 10 cm.  The subcu was cleaned and irrigated thoroughly and then 2 Penrose placed, 1 medial and 1 lateral with a very loose closure using 1 PDS suture and 2 nylons.  Sterile gently compressive dressing was applied using Mepitel gauze and ABD and Kerlix bilaterally.  Patient had Ace wraps applied, then was awake from anesthesia and transported to PACU in stable condition.  Because of his AFib which was  current preoperatively and persisted intermittently during the case alternating with sinus rhythm, the decision was made to keep the patient overnight for monitoring and contacting his PCP in the morning, so that adequate cardiac followup could be established.  He remains at increased risk for infection and will be watched closely for that as well.     Doralee Albino. Carola Frost, M.D.     MHH/MEDQ  D:  02/20/2013  T:  02/21/2013  Job:  [id number]

## 2013-02-22 ENCOUNTER — Encounter (HOSPITAL_COMMUNITY): Payer: Self-pay | Admitting: Orthopedic Surgery

## 2013-02-22 DIAGNOSIS — D62 Acute posthemorrhagic anemia: Secondary | ICD-10-CM | POA: Diagnosis present

## 2013-02-22 DIAGNOSIS — K259 Gastric ulcer, unspecified as acute or chronic, without hemorrhage or perforation: Secondary | ICD-10-CM | POA: Diagnosis present

## 2013-02-22 DIAGNOSIS — I119 Hypertensive heart disease without heart failure: Secondary | ICD-10-CM | POA: Diagnosis present

## 2013-02-22 MED ORDER — OXYCODONE-ACETAMINOPHEN 5-325 MG PO TABS: 1.0000 | ORAL_TABLET | Freq: Four times a day (QID) | ORAL | Status: DC | PRN

## 2013-02-22 MED ORDER — CARVEDILOL 12.5 MG PO TABS: 12.5000 mg | ORAL_TABLET | Freq: Two times a day (BID) | ORAL | Status: DC

## 2013-02-22 MED ORDER — DSS 100 MG PO CAPS: 100.0000 mg | ORAL_CAPSULE | Freq: Two times a day (BID) | ORAL | Status: DC

## 2013-02-22 MED ORDER — ASPIRIN 81 MG PO TBEC: 81.0000 mg | DELAYED_RELEASE_TABLET | Freq: Every day | ORAL | Status: AC

## 2013-02-22 MED ORDER — OXYCODONE HCL 5 MG PO TABS: 5.0000 mg | ORAL_TABLET | ORAL | Status: DC | PRN

## 2013-02-22 NOTE — Progress Notes (Signed)
Orthopaedic Trauma Service Progress Note     2 Days Post-Op  Subjective   Doing well  Ready to go home No new issues Ambulating w/o any assistive devices   Appreciate cardiology consult    Objective  BP 133/77  Pulse 99  Temp(Src) 99.7 F (37.6 C) (Oral)  Resp 16  SpO2 97%  Patient Vitals for the past 24 hrs:  BP Temp Pulse Resp SpO2  02/22/13 0546 133/77 mmHg 99.7 F (37.6 C) 99 16 97 %  02/22/13 0400 - 100.2 F (37.9 C) - - -  02/21/13 2207 120/47 mmHg 99.2 F (37.3 C) 91 14 100 %  02/21/13 1438 - - 110 - -  02/21/13 1300 124/59 mmHg 99 F (37.2 C) 104 18 100 %    Intake/Output     06/26 0701 - 06/27 0700 06/27 0701 - 06/28 0700   P.O. 240    I.V. 600    Total Intake 840     Urine 1100    Blood     Total Output 1100     Net -260            Labs No new labs  Exam  Gen: awake and alert, sitting in bedside chair Lungs: clear B  Cardiac: irreg irreg, s1 and s2 Abd: + BS, NT, soft Ext:            Right Lower Extremity    Dressing removed  Wound stable   No erythema or purulence  Drain d/c'd  Distal motor and sensory functions intact  Ext warm  + DP pulse  No DCT  Compartments soft and NT       Left Lower Extremity  Dressing removed  Drains d/c'd  Wound stable   No erythema   No purulence   No other signs of infect  Distal motor and sensory functions intact  Ext warm   + DP pulse  Compartments soft and NT  No DCT    Assessment and Plan  2 Days Post-Op  52 y/o male s/p fall of ladder and trailer  1. Fall 2. Complex lacerations to B LEx s/p I&D             WBAT             ROM as tolerated             Dressing changed  Will have pt do another dressing change on Sunday              reviewed wound care with pt             Continue with ice and elevation  3. A-fib             appreciate cards consult  Pt to f/u with cards in [place] in about 1 week  Medication changes noted  Will start asa 81 mg daily at d/c  4. ABL  anemia:             Mild  5. Pain management:             Continue with current management   6. DVT/PE prophylaxis:             per #3  Covered with lovenox while inpatient  7. ID:               completed course of ancef  Will not d/c home with abx   8. Activity:  WBAT B LEx             OOB ad lib  9. FEN/Foley/Lines:             d/c IV and IVF  10. Dispo:             D/c home today   Mearl Latin, PA-C [facility] [phone] (P) 02/22/2013 8:54 AM

## 2013-02-22 NOTE — Discharge Summary (Signed)
Orthopaedic Trauma Service (OTS)  Patient ID: Darren Alexander MRN: [id number] DOB/AGE: 1961/03/11 52 y.o.  Admit date: 02/20/2013 Discharge date: 02/22/2013  Admission Diagnoses: Complex lacerations to B LEx A-fib HTN Gastric ulcer  Discharge Diagnoses:  Principal Problem:   Laceration of left lower leg Active Problems:   PAF (paroxysmal atrial fibrillation) with rapid ventricular response   Acute blood loss anemia   Gastric ulcer   HTN (hypertension)   Procedures Performed: 02/20/2013- Dr. Carola Frost   Discharged Condition: good  Hospital Course:   Patient is a 52 year old white male who was injured after falling off of a tractor trailer. He sustained complex lacerations to the bilateral lower extremities as well as abrasions to his bilateral upper extremities. Patient was brought to [facility] emergency department for evaluation. Attempts were made to close lacerations in the emergency department however patient was having pretty profound bleeding from his wounds which precluded adequate debridement of the wounds and closure. Orthopedics was consulted regarding his injuries and was taken to the operating room on 02/20/2013 for irrigation and debridement of bilateral lower extremity lacerations as well as primary closure. It was noted on evaluation in the emergency department the patient was in atrial fibrillation as well. Patient had reported a previous history of A. fib after having a bleeding ulcer that was cared for at [facility]. He stated that he converted back to normal sinus rhythm afterwards. After surgery patient was admitted to the orthopedic floor for observation, pain control, therapies and he was placed on telemetry. The following morning I did speak with him on rounds and he did relay that he does have a cardiologist in [place]. I did contact the cardiology office in [place] I spoke to the nurse for the  physician who indicated that the patient's last EKG  at their office noted that the patient was in normal sinus rhythm. As such I did obtain a cardiology consult here at our hospital. Patient was seen and evaluated by [facility]. They did make some adjustments to his cardiac medications which we do not have at the time of admission as his medication reconciliation was not performed I given the emergent nature of his injuries. He was restarted on his cardiac meds on postop day 1, his digoxin his Coreg and diltiazem were continued. We also added low-dose aspirin at time of discharge as well . Patient did not have any significant issues during his hospital stay. He did remain in A. fib during his entire hospital stay and at the time of discharge as well. The patient was covered with Lovenox for DVT and PE prophylaxis during the hospital stay. He was treated with Ancef for 48 hours given the open nature of his lower extremity injuries. He did participate with physical therapy and tolerated therapy very well. He was able to immobility without any assistive devices. On postoperative day #2 dressing changes were performed and his Penrose drains were removed. His wounds look stable. There weren't any signs of infection. No purulence or active drainage. Patient was tolerating regular diet, he was voiding on his own and his pain was well-controlled. He was deemed to be stable for discharge on postoperative day #2. He will followup with his PCP and his cardiologist within the next one to 2 weeks. We will check the patient back in our office on 02/27/2013 for wound check.  Consults: cardiology- [facility]   Significant Diagnostic Studies: labs:  Results for AMOND, SPERANZA (MRN [id number])  as of 02/22/2013 08:55  Ref. Range 02/21/2013 04:40  WBC Latest Range: 4.0-10.5 K/uL 14.4 (H)  RBC Latest Range: 4.22-5.81 MIL/uL 4.88  Hemoglobin Latest Range: 13.0-17.0 g/dL 45.4 (L)  HCT Latest Range: 39.0-52.0 % 36.4 (L)  MCV Latest Range: 78.0-100.0 fL 74.6 (L)   MCH Latest Range: 26.0-34.0 pg 22.3 (L)  MCHC Latest Range: 30.0-36.0 g/dL 09.8 (L)  RDW Latest Range: 11.5-15.5 % 21.3 (H)  Platelets Latest Range: 150-400 K/uL 270     Treatments: IV hydration, antibiotics: Ancef, analgesia: acetaminophen, Morphine and oxycodone, cardiac meds: carvedilol and diltiazem, anticoagulation: LMW heparin, therapies: PT and RN and surgery: as above   Discharge Exam:  Orthopaedic Trauma Service Progress Note                                          2 Days Post-Op  Subjective   Doing well   Ready to go home No new issues Ambulating w/o any assistive devices   Appreciate cardiology consult    Objective  BP 133/77  Pulse 99  Temp(Src) 99.7 F (37.6 C) (Oral)  Resp 16  SpO2 97%  Patient Vitals for the past 24 hrs:   BP  Temp  Pulse  Resp  SpO2   02/22/13 0546  133/77 mmHg  99.7 F (37.6 C)  99  16  97 %   02/22/13 0400  -  100.2 F (37.9 C)  -  -  -   02/21/13 2207  120/47 mmHg  99.2 F (37.3 C)  91  14  100 %   02/21/13 1438  -  -  110  -  -   02/21/13 1300  124/59 mmHg  99 F (37.2 C)  104  18  100 %     Intake/Output     06/26 0701 - 06/27 0700 06/27 0701 - 06/28 0700    P.O. 240     I.V. 600     Total Intake 840      Urine 1100     Blood      Total Output 1100      Net -260              Labs No new labs  Exam  Gen: awake and alert, sitting in bedside chair Lungs: clear B   Cardiac: irreg irreg, s1 and s2 Abd: + BS, NT, soft Ext:             Right Lower Extremity               Dressing removed             Wound stable                         No erythema or purulence             Drain d/c'd             Distal motor and sensory functions intact             Ext warm             + DP pulse             No DCT             Compartments soft and NT       Left Lower  Extremity             Dressing removed             Drains d/c'd             Wound stable                         No erythema                          No purulence                         No other signs of infect             Distal motor and sensory functions intact             Ext warm               + DP pulse             Compartments soft and NT             No DCT    Assessment and Plan  2 Days Post-Op  52 y/o male s/p fall of ladder and trailer  1. Fall 2. Complex lacerations to B LEx s/p I&D             WBAT             ROM as tolerated             Dressing changed             Will have pt do another dressing change on Sunday               reviewed wound care with pt             Continue with ice and elevation  3. A-fib             appreciate cards consult             Pt to f/u with cards in [place] in about 1 week             Medication changes noted             Will start asa 81 mg daily at d/c  4. ABL anemia:             Mild  5. Pain management:             Continue with current management   6. DVT/PE prophylaxis:             per #3             Covered with lovenox while inpatient  7. ID:               completed course of ancef             Will not d/c home with abx   8. Activity:             WBAT B LEx             OOB ad lib  9. FEN/Foley/Lines:             d/c IV and IVF  10. Dispo:             D/c home today    Disposition: home  Discharge Orders   Future Orders Complete By Expires     Call MD / Call 911  As directed     Comments:      If you experience chest pain or shortness of breath, CALL 911 and be transported to the hospital emergency room.  If you develope a fever above 101 F, pus (white drainage) or increased drainage or redness at the wound, or calf pain, call your surgeon's office.    Constipation Prevention  As directed     Comments:      Drink plenty of fluids.  Prune juice may be helpful.  You may use a stool softener, such as Colace (over the counter) 100 mg twice a day.  Use MiraLax (over the counter) for constipation as needed.    Diet - low sodium heart healthy   As directed     Discharge instructions  As directed     Comments:      Orthopaedic Trauma Service Discharge Instructions   General Discharge Instructions  WEIGHT BEARING STATUS: Weightbear as tolerated Bilateral Lower extremities  RANGE OF MOTION/ACTIVITY: activity as tolerated  Wound care: dressing changes starting Sunday 02/24/2013. See below for wound care instructions   Diet: as you were eating previously.  Can use over the counter stool softeners and bowel preparations, such as Miralax, to help with bowel movements.  Narcotics can be constipating.  Be sure to drink plenty of fluids  STOP SMOKING OR USING NICOTINE PRODUCTS!!!!  As discussed nicotine severely impairs your body's ability to heal surgical and traumatic wounds but also impairs bone healing.  Wounds and bone heal by forming microscopic blood vessels (angiogenesis) and nicotine is a vasoconstrictor (essentially, shrinks blood vessels).  Therefore, if vasoconstriction occurs to these microscopic blood vessels they essentially disappear and are unable to deliver necessary nutrients to the healing tissue.  This is one modifiable factor that you can do to dramatically increase your chances of healing your injury.    (This means no smoking, no nicotine gum, patches, etc)  DO NOT USE NONSTEROIDAL ANTI-INFLAMMATORY DRUGS (NSAID'S)  Using products such as Advil (ibuprofen), Aleve (naproxen), Motrin (ibuprofen) for additional pain control during fracture healing can delay and/or prevent the healing response.  If you would like to take over the counter (OTC) medication, Tylenol (acetaminophen) is ok.  However, some narcotic medications that are given for pain control contain acetaminophen as well. Therefore, you should not exceed more than 4000 mg of tylenol in a day if you do not have liver disease.  Also note that there are may OTC medicines, such as cold medicines and allergy medicines that my contain tylenol as well.  If you have any  questions about medications and/or interactions please ask your doctor/PA or your pharmacist.   PAIN MEDICATION USE AND EXPECTATIONS  You have likely been given narcotic medications to help control your pain.  After a traumatic event that results in an fracture (broken bone) with or without surgery, it is ok to use narcotic pain medications to help control one's pain.  We understand that everyone responds to pain differently and each individual patient will be evaluated on a regular basis for the continued need for narcotic medications. Ideally, narcotic medication use should last no more than 6-8 weeks (coinciding with fracture healing).   As a patient it is your responsibility as well to monitor narcotic medication use and report the amount and frequency you use these medications when you come to your office visit.   We  would also advise that if you are using narcotic medications, you should take a dose prior to therapy to maximize you participation.  IF YOU ARE ON NARCOTIC MEDICATIONS IT IS NOT PERMISSIBLE TO OPERATE A MOTOR VEHICLE (MOTORCYCLE/CAR/TRUCK/MOPED) OR HEAVY MACHINERY DO NOT MIX NARCOTICS WITH OTHER CNS (CENTRAL NERVOUS SYSTEM) DEPRESSANTS SUCH AS ALCOHOL       ICE AND ELEVATE INJURED/OPERATIVE EXTREMITY  Using ice and elevating the injured extremity above your heart can help with swelling and pain control.  Icing in a pulsatile fashion, such as 20 minutes on and 20 minutes off, can be followed.    Do not place ice directly on skin. Make sure there is a barrier between to skin and the ice pack.    Using frozen items such as frozen peas works well as the conform nicely to the are that needs to be iced.  USE AN ACE WRAP OR TED HOSE FOR SWELLING CONTROL  In addition to icing and elevation, Ace wraps or TED hose are used to help limit and resolve swelling.  It is recommended to use Ace wraps or TED hose until you are informed to stop.    When using Ace Wraps start the wrapping distally  (farthest away from the body) and wrap proximally (closer to the body)   Example: If you had surgery on your leg or thing and you do not have a splint on, start the ace wrap at the toes and work your way up to the thigh        If you had surgery on your upper extremity and do not have a splint on, start the ace wrap at your fingers and work your way up to the upper arm  IF YOU ARE IN A SPLINT OR CAST DO NOT REMOVE IT FOR ANY REASON   If your splint gets wet for any reason please contact the office immediately. You may shower in your splint or cast as long as you keep it dry.  This can be done by wrapping in a cast cover or garbage back (or similar)  Do Not stick any thing down your splint or cast such as pencils, money, or hangers to try and scratch yourself with.  If you feel itchy take benadryl as prescribed on the bottle for itching  IF YOU ARE IN A CAM BOOT (BLACK BOOT)  You may remove boot periodically. Perform daily dressing changes as noted below.  Wash the liner of the boot regularly and wear a sock when wearing the boot. It is recommended that you sleep in the boot until told otherwise  CALL THE OFFICE WITH ANY QUESTIONS OR CONCERTS: [phone]     Discharge Pin Site Instructions  Dress pins daily with Kerlix roll starting on POD 2. Wrap the Kerlix so that it tamps the skin down around the pin-skin interface to prevent/limit motion of the skin relative to the pin.  (Pin-skin motion is the primary cause of pain and infection related to external fixator pin sites).  Remove any crust or coagulum that may obstruct drainage with a saline moistened gauze or soap and water.  After POD 3, if there is no discernable drainage on the pin site dressing, the interval for change can by increased to every other day.  You may shower with the fixator, cleaning all pin sites gently with soap and water.  If you have a surgical wound this needs to be completely dry and without drainage before  showering.  The extremity can be  lifted by the fixator to facilitate wound care and transfers.  Notify the office/Doctor if you experience increasing drainage, redness, or pain from a pin site, or if you notice purulent (thick, snot-like) drainage.  Discharge Wound Care Instructions  Do NOT apply any ointments, solutions or lotions to pin sites or surgical wounds.  These prevent needed drainage and even though solutions like hydrogen peroxide kill bacteria, they also damage cells lining the pin sites that help fight infection.  Applying lotions or ointments can keep the wounds moist and can cause them to breakdown and open up as well. This can increase the risk for infection. When in doubt call the office.  Surgical incisions should be dressed daily.  If any drainage is noted, use one layer of adaptic, then gauze, Kerlix, and an ace wrap.  Once the incision is completely dry and without drainage, it may be left open to air out.  Showering may begin 36-48 hours later.  Cleaning gently with soap and water.  Traumatic wounds should be dressed daily as well.    One layer of adaptic, gauze, Kerlix, then ace wrap.  The adaptic can be discontinued once the draining has ceased    If you have a wet to dry dressing: wet the gauze with saline the squeeze as much saline out so the gauze is moist (not soaking wet), place moistened gauze over wound, then place a dry gauze over the moist one, followed by Kerlix wrap, then ace wrap.    Increase activity slowly as tolerated  As directed         Medication List    STOP taking these medications       digoxin 0.125 MG tablet  Commonly known as:  LANOXIN      TAKE these medications       amLODipine-benazepril 10-20 MG per capsule  Commonly known as:  LOTREL  Take 1 capsule by mouth daily.     aspirin 81 MG EC tablet  Take 1 tablet (81 mg total) by mouth daily. Swallow whole.     carvedilol 12.5 MG tablet  Commonly known as:  COREG  Take 1  tablet (12.5 mg total) by mouth 2 (two) times daily with a meal.     diltiazem 240 MG 24 hr capsule  Commonly known as:  DILACOR XR  Take 240 mg by mouth daily.     DSS 100 MG Caps  Take 100 mg by mouth 2 (two) times daily.     furosemide 40 MG tablet  Commonly known as:  LASIX  Take 40 mg by mouth daily.     oxyCODONE 5 MG immediate release tablet  Commonly known as:  Oxy IR/ROXICODONE  Take 1-2 tablets (5-10 mg total) by mouth every 4 (four) hours as needed for pain (breakthrough pain only).     oxyCODONE-acetaminophen 5-325 MG per tablet  Commonly known as:  ROXICET  Take 1-2 tablets by mouth every 6 (six) hours as needed for pain.     pantoprazole 40 MG tablet  Commonly known as:  PROTONIX  Take 40 mg by mouth daily.     potassium chloride 10 MEQ tablet  Commonly known as:  K-DUR,KLOR-CON  Take 10 mEq by mouth daily.     tadalafil 10 MG tablet  Commonly known as:  CIALIS  Take 10 mg by mouth daily as needed for erectile dysfunction.     zolpidem 5 MG tablet  Commonly known as:  AMBIEN  Take 5 mg by mouth  at bedtime as needed for sleep.       Follow-up Information   Follow up with Budd Palmer, MD. Schedule an appointment as soon as possible for a visit on 02/27/2013. (call to schedule time )    Contact information:   [address] [place] [address] [phone]       Follow up with KRASOWSKI,ROBERT, MD. Schedule an appointment as soon as possible for a visit in 1 week.   Contact information:   [address] [place] [address] [phone]       Discharge Instructions and Plan:  Patient does not have any formal restrictions regarding weightbearing or activities. He will perform dressing changes in the next 2 days. Have included a wound care instruction sheet for the patient and reviewed wound care with the patient as well. No ointments, lotions or solution is to be applied to his wound. Dry dressings  only. once his wounds are completely dry and aren't draining he may begin to shower. The patient will followup with cardiology in the next one to 2 weeks.  We will see the patient back in our office in about 5 days for recheck as well. He'll contact us if it does become increasingly red or any purulence is noted. I would expect for his wounds to heal up uneventfully however there was a lot of trauma to the soft tissue and healing may be a prolonged period Medication changes are noted above the list. Patient can resume a diet that is on pre-hospital admission   Signed:  Mearl Latin, PA-C [facility] [phone] (P) 02/22/2013, 9:17 AM

## 2013-02-26 NOTE — Progress Notes (Signed)
02/21/13 1441  PT Time Calculation  PT Start Time 1417  PT Stop Time 1436  PT Time Calculation (min) 19 min  PT G-Codes **NOT FOR INPATIENT CLASS**  Functional Assessment Tool Used clinical judgement  Functional Limitation Mobility: Walking and moving around  Mobility: Walking and Moving Around Current Status [phone]) [facility]  Mobility: Walking and Moving Around Goal Status (Q6578) [facility]  Mobility: Walking and Moving Around Discharge Status (I6962) [facility]  PT General Charges  $$ ACUTE PT VISIT 1 Procedure  PT Evaluation  $Initial PT Evaluation Tier I 1 Procedure  PT Treatments  $Therapeutic Activity 8-22 mins  Addition to note from 02/21/13 Delaney Meigs, PT [phone]

## 2013-03-07 NOTE — H&P (Signed)
I have seen and examined the patient. I agree with the findings above.  I discussed with the patient the risks and benefits of surgery for right and left calf wounds, including the possibility of infection, nerve injury, vessel injury, wound breakdown, DVT/ PE, loss of motion, and need for further surgery among others.  He understood these risks and wished to proceed.   Budd Palmer, MD

## 2014-03-24 DIAGNOSIS — M25569 Pain in unspecified knee: Secondary | ICD-10-CM

## 2014-03-24 DIAGNOSIS — G8929 Other chronic pain: Secondary | ICD-10-CM | POA: Insufficient documentation

## 2014-04-23 DIAGNOSIS — B351 Tinea unguium: Secondary | ICD-10-CM

## 2014-04-23 HISTORY — DX: Tinea unguium: B35.1

## 2016-02-15 DIAGNOSIS — G4700 Insomnia, unspecified: Secondary | ICD-10-CM | POA: Insufficient documentation

## 2016-02-15 DIAGNOSIS — G4733 Obstructive sleep apnea (adult) (pediatric): Secondary | ICD-10-CM

## 2016-02-15 HISTORY — DX: Obstructive sleep apnea (adult) (pediatric): G47.33

## 2016-02-15 HISTORY — DX: Hemochromatosis, unspecified: E83.119

## 2016-04-04 DIAGNOSIS — R3589 Other polyuria: Secondary | ICD-10-CM | POA: Insufficient documentation

## 2016-04-04 DIAGNOSIS — R358 Other polyuria: Secondary | ICD-10-CM | POA: Insufficient documentation

## 2016-04-06 DIAGNOSIS — D509 Iron deficiency anemia, unspecified: Secondary | ICD-10-CM

## 2016-04-06 HISTORY — DX: Iron deficiency anemia, unspecified: D50.9

## 2016-05-08 DIAGNOSIS — R7303 Prediabetes: Secondary | ICD-10-CM

## 2016-05-08 HISTORY — DX: Prediabetes: R73.03

## 2016-05-10 DIAGNOSIS — N529 Male erectile dysfunction, unspecified: Secondary | ICD-10-CM

## 2016-05-10 HISTORY — DX: Male erectile dysfunction, unspecified: N52.9

## 2017-02-26 ENCOUNTER — Encounter (HOSPITAL_COMMUNITY): Payer: Self-pay | Admitting: *Deleted

## 2017-02-26 ENCOUNTER — Emergency Department (HOSPITAL_COMMUNITY)
Admission: EM | Admit: 2017-02-26 | Discharge: 2017-02-26 | Disposition: A | Discharge status: Home / Self Care | Payer: Commercial Managed Care - PPO | Attending: Emergency Medicine | Admitting: Emergency Medicine

## 2017-02-26 ENCOUNTER — Emergency Department (HOSPITAL_COMMUNITY): Payer: Commercial Managed Care - PPO

## 2017-02-26 DIAGNOSIS — Z7982 Long term (current) use of aspirin: Secondary | ICD-10-CM | POA: Diagnosis not present

## 2017-02-26 DIAGNOSIS — N50812 Left testicular pain: Secondary | ICD-10-CM | POA: Diagnosis present

## 2017-02-26 DIAGNOSIS — I4891 Unspecified atrial fibrillation: Secondary | ICD-10-CM | POA: Insufficient documentation

## 2017-02-26 DIAGNOSIS — N50819 Testicular pain, unspecified: Secondary | ICD-10-CM

## 2017-02-26 DIAGNOSIS — I10 Essential (primary) hypertension: Secondary | ICD-10-CM | POA: Insufficient documentation

## 2017-02-26 DIAGNOSIS — Z79899 Other long term (current) drug therapy: Secondary | ICD-10-CM | POA: Insufficient documentation

## 2017-02-26 HISTORY — DX: Obesity, unspecified: E66.9

## 2017-02-26 LAB — URINALYSIS, ROUTINE W REFLEX MICROSCOPIC
Bilirubin Urine: NEGATIVE
Glucose, UA: NEGATIVE mg/dL
Hgb urine dipstick: NEGATIVE
Ketones, ur: NEGATIVE mg/dL
Leukocytes, UA: NEGATIVE
Nitrite: NEGATIVE
Protein, ur: NEGATIVE mg/dL
Specific Gravity, Urine: 1.016 (ref 1.005–1.030)
pH: 5 (ref 5.0–8.0)

## 2017-02-26 LAB — CBC
HCT: 52.1 % — ABNORMAL HIGH (ref 39.0–52.0)
Hemoglobin: 17.7 g/dL — ABNORMAL HIGH (ref 13.0–17.0)
MCH: 30.8 pg (ref 26.0–34.0)
MCHC: 34 g/dL (ref 30.0–36.0)
MCV: 90.8 fL (ref 78.0–100.0)
Platelets: 182 10*3/uL (ref 150–400)
RBC: 5.74 MIL/uL (ref 4.22–5.81)
RDW: 13.1 % (ref 11.5–15.5)
WBC: 15.9 10*3/uL — ABNORMAL HIGH (ref 4.0–10.5)

## 2017-02-26 LAB — I-STAT CHEM 8, ED
BUN: 11 mg/dL (ref 6–20)
Calcium, Ion: 1.1 mmol/L — ABNORMAL LOW (ref 1.15–1.40)
Chloride: 104 mmol/L (ref 101–111)
Creatinine, Ser: 0.7 mg/dL (ref 0.61–1.24)
Glucose, Bld: 98 mg/dL (ref 65–99)
HCT: 53 % — ABNORMAL HIGH (ref 39.0–52.0)
Hemoglobin: 18 g/dL — ABNORMAL HIGH (ref 13.0–17.0)
Potassium: 4.2 mmol/L (ref 3.5–5.1)
Sodium: 138 mmol/L (ref 135–145)
TCO2: 24 mmol/L (ref 0–100)

## 2017-02-26 LAB — POC OCCULT BLOOD, ED: Fecal Occult Bld: NEGATIVE

## 2017-02-26 MED ORDER — METOPROLOL TARTRATE 50 MG PO TABS: 25.0000 mg | ORAL_TABLET | Freq: Two times a day (BID) | ORAL | 0 refills | Status: DC

## 2017-02-26 MED ORDER — OXYCODONE-ACETAMINOPHEN 5-325 MG PO TABS: 1.0000 | ORAL_TABLET | Freq: Four times a day (QID) | ORAL | 0 refills | Status: DC | PRN

## 2017-02-26 MED ORDER — SODIUM CHLORIDE 0.9 % IV BOLUS (SEPSIS)
1000.0000 mL | Freq: Once | INTRAVENOUS | Status: AC
  Administered 2017-02-26: 1000 mL via INTRAVENOUS

## 2017-02-26 MED ORDER — OXYCODONE-ACETAMINOPHEN 5-325 MG PO TABS
1.0000 | ORAL_TABLET | Freq: Once | ORAL | Status: AC
  Administered 2017-02-26: 1 via ORAL
  Filled 2017-02-26: qty 1

## 2017-02-26 MED ORDER — SULFAMETHOXAZOLE-TRIMETHOPRIM 800-160 MG PO TABS: 1.0000 | ORAL_TABLET | Freq: Two times a day (BID) | ORAL | 0 refills | Status: AC

## 2017-02-26 MED ORDER — METOPROLOL TARTRATE 25 MG PO TABS
25.0000 mg | ORAL_TABLET | Freq: Once | ORAL | Status: AC
  Administered 2017-02-26: 25 mg via ORAL
  Filled 2017-02-26: qty 1

## 2017-02-26 MED ORDER — DEXTROSE 5 % IV SOLN
1.0000 g | Freq: Once | INTRAVENOUS | Status: AC
  Administered 2017-02-26: 1 g via INTRAVENOUS
  Filled 2017-02-26: qty 10

## 2017-02-26 NOTE — ED Triage Notes (Signed)
Pt reports testicle and groin pain since Saturday and reports generalized fatigue/weakness.

## 2017-02-26 NOTE — Discharge Instructions (Signed)
Take Tylenol for mild pain or the pain medicine prescribed for bad pain. Don't take Tylenol together with the pain medicine prescribed as the combination can be dangerous to the liver. Call Dr. Belva CromeWrenn's office at [facility] tomorrow at 8 AM to arrange to be seen tomorrow by the next available provider for your testicular pain. Tell office staff that Dr.Janautica Netzley spoke with Dr.Wrenn about your case. Call [place] [facility] office tomorrow to arrange to be seen within the next few weeks for your irregular heartbeat. Return if concern for any reason

## 2017-02-26 NOTE — ED Notes (Signed)
Patient Alert and oriented X4. Stable and ambulatory. Patient verbalized understanding of the discharge instructions.  Patient belongings were taken by the patient.  

## 2017-02-26 NOTE — ED Provider Notes (Signed)
[facility]-EMERGENCY DEPT Provider Note   CSN: [id number] Arrival date & time: 02/26/17  1056     History   Chief Complaint Chief Complaint  Patient presents with  . Testicle Pain    HPI Darren Alexander is a 56 y.o. male.Complains of left testicular pain onset 3 days ago. Pain worse with walking improved remaining still.. Associated symptoms include generalized weakness for 1 day and 2 episodes of diarrhea yesterday. He is uncertain if he suffered blood in diarrhea. No fever no nausea or vomiting no chest pain no abdominal pain he treated himself with one oxycodone tablet this morning with partial relief.. Other associated symptoms urinary frequency. No penile discharge. No nausea or vomiting  HPI  Past Medical History:  Diagnosis Date  . Gastric ulcer   . HTN (hypertension)   . Obesity   . PAF (paroxysmal atrial fibrillation) [facility])     Patient Active Problem List   Diagnosis Date Noted  . Acute blood loss anemia 02/22/2013  . Gastric ulcer   . HTN (hypertension)   . PAF (paroxysmal atrial fibrillation) with rapid ventricular response 02/21/2013  . Laceration of left lower leg 02/21/2013    Past Surgical History:  Procedure Laterality Date  . I&D EXTREMITY Bilateral 02/20/2013   Procedure: IRRIGATION AND DEBRIDEMENT bilateral lower legs ;  Surgeon: Budd Palmer, MD;  Location: [facility] OR;  Service: Orthopedics;  Laterality: Bilateral;  . ORIF Left humerus  2012       Home Medications    Prior to Admission medications   Medication Sig Start Date End Date Taking? Authorizing Provider  amLODipine-benazepril (LOTREL) 10-20 MG per capsule Take 1 capsule by mouth daily.    [provider]  aspirin 81 MG EC tablet Take 1 tablet (81 mg total) by mouth daily. Swallow whole. 02/22/13   Montez Morita, PA-C  carvedilol (COREG) 12.5 MG tablet Take 1 tablet (12.5 mg total) by mouth 2 (two) times daily with a meal. 02/22/13   Montez Morita, PA-C  diltiazem (DILACOR XR) 240 MG 24 hr  capsule Take 240 mg by mouth daily.    [provider]  docusate sodium 100 MG CAPS Take 100 mg by mouth 2 (two) times daily. 02/22/13   Montez Morita, PA-C  furosemide (LASIX) 40 MG tablet Take 40 mg by mouth daily.    [provider]  oxyCODONE (OXY IR/ROXICODONE) 5 MG immediate release tablet Take 1-2 tablets (5-10 mg total) by mouth every 4 (four) hours as needed for pain (breakthrough pain only). 02/22/13   Montez Morita, PA-C  oxyCODONE-acetaminophen (ROXICET) 5-325 MG per tablet Take 1-2 tablets by mouth every 6 (six) hours as needed for pain. 02/22/13   Montez Morita, PA-C  pantoprazole (PROTONIX) 40 MG tablet Take 40 mg by mouth daily.    [provider]  potassium chloride (K-DUR,KLOR-CON) 10 MEQ tablet Take 10 mEq by mouth daily.    [provider]  tadalafil (CIALIS) 10 MG tablet Take 10 mg by mouth daily as needed for erectile dysfunction.    [provider]  zolpidem (AMBIEN) 5 MG tablet Take 5 mg by mouth at bedtime as needed for sleep.    [provider]    Family History History reviewed. No pertinent family history.  Social History Social History  Substance Use Topics  . Smoking status: Never Smoker  . Smokeless tobacco: Not on file  . Alcohol use No     Allergies   Patient has no known allergies.   Review  of Systems Review of Systems  HENT: Negative.   Respiratory: Negative.   Cardiovascular: Positive for chest pain.       Syncope  Gastrointestinal: Negative.   Genitourinary: Positive for frequency and testicular pain.  Musculoskeletal: Negative.   Skin: Negative.   Allergic/Immunologic: Negative.   Neurological: Positive for weakness.       Generalized weakness  Psychiatric/Behavioral: Negative.   All other systems reviewed and are negative.    Physical Exam Updated Vital Signs BP (!) 127/95   Pulse 100   Temp 98.2 F (36.8 C) (Oral)   Resp 18   Ht 6\' 2"  (1.88 m)   Wt 127.9 kg (282 lb)   SpO2 98%    BMI 36.21 kg/m   Physical Exam  Constitutional: He appears well-developed and well-nourished.  HENT:  Head: Normocephalic and atraumatic.  Eyes: Conjunctivae are normal. Pupils are equal, round, and reactive to light.  Neck: Neck supple. No tracheal deviation present. No thyromegaly present.  Cardiovascular:  No murmur heard. Mildly tachycardic irregularly irregular  Pulmonary/Chest: Effort normal and breath sounds normal.  Abdominal: Soft. Bowel sounds are normal. He exhibits no distension. There is no tenderness.  Genitourinary: Prostate normal and penis normal. Rectal exam shows guaiac negative stool.  Genitourinary Comments: Scrotum minimally tender left side, posteriorly. Testes appear normal on. Rectum normal tone no gross blood  Musculoskeletal: Normal range of motion. He exhibits no edema or tenderness.  Neurological: He is alert. Coordination normal.  Skin: Skin is warm and dry. No rash noted.  Psychiatric: He has a normal mood and affect.  Nursing note and vitals reviewed.    ED Treatments / Results  Labs (all labs ordered are listed, but only abnormal results are displayed) Labs Reviewed  CBC  RPR  HIV ANTIBODY (ROUTINE TESTING)  URINALYSIS, ROUTINE W REFLEX MICROSCOPIC  POC OCCULT BLOOD, ED  I-STAT CHEM 8, ED  GC/CHLAMYDIA PROBE AMP ([place]) NOT AT [facility]    EKG  EKG Interpretation  Date/Time:  Sunday February 26 2017 12:42:46 EDT Ventricular Rate:  117 PR Interval:    QRS Duration: 100 QT Interval:  334 QTC Calculation: 444 R Axis:   73 Text Interpretation:  Atrial fibrillation Borderline T abnormalities, inferior leads SINCE LAST TRACING HEART RATE HAS INCREASED Confirmed by Doug Sou [phone]) on 02/26/2017 1:08:08 PM      Results for orders placed or performed during the hospital encounter of 02/26/17  Urinalysis, Routine w reflex microscopic  Result Value Ref Range   Color, Urine YELLOW YELLOW   APPearance CLEAR CLEAR   Specific Gravity, Urine  1.016 1.005 - 1.030   pH 5.0 5.0 - 8.0   Glucose, UA NEGATIVE NEGATIVE mg/dL   Hgb urine dipstick NEGATIVE NEGATIVE   Bilirubin Urine NEGATIVE NEGATIVE   Ketones, ur NEGATIVE NEGATIVE mg/dL   Protein, ur NEGATIVE NEGATIVE mg/dL   Nitrite NEGATIVE NEGATIVE   Leukocytes, UA NEGATIVE NEGATIVE  CBC  Result Value Ref Range   WBC 15.9 (H) 4.0 - 10.5 K/uL   RBC 5.74 4.22 - 5.81 MIL/uL   Hemoglobin 17.7 (H) 13.0 - 17.0 g/dL   HCT 60.4 (H) 54.0 - 98.1 %   MCV 90.8 78.0 - 100.0 fL   MCH 30.8 26.0 - 34.0 pg   MCHC 34.0 30.0 - 36.0 g/dL   RDW 19.1 47.8 - 29.5 %   Platelets 182 150 - 400 K/uL  I-stat chem 8, ed  Result Value Ref Range   Sodium 138 135 - 145 mmol/L  Potassium 4.2 3.5 - 5.1 mmol/L   Chloride 104 101 - 111 mmol/L   BUN 11 6 - 20 mg/dL   Creatinine, Ser 1.61 0.61 - 1.24 mg/dL   Glucose, Bld 98 65 - 99 mg/dL   Calcium, Ion 0.96 (L) 1.15 - 1.40 mmol/L   TCO2 24 0 - 100 mmol/L   Hemoglobin 18.0 (H) 13.0 - 17.0 g/dL   HCT 04.5 (H) 40.9 - 81.1 %  POC occult blood, ED Provider will collect  Result Value Ref Range   Fecal Occult Bld NEGATIVE NEGATIVE   US Scrotum  Result Date: 02/26/2017 CLINICAL DATA:  LEFT testicular pain since Friday EXAM: SCROTAL ULTRASOUND DOPPLER ULTRASOUND OF THE TESTICLES TECHNIQUE: Complete ultrasound examination of the testicles, epididymis, and other scrotal structures was performed. Color and spectral Doppler ultrasound were also utilized to evaluate blood flow to the testicles. COMPARISON:  None FINDINGS: Right testicle Measurements: 3.8 x 1.8 x 2.9 cm. Normal morphology without mass or calcification. Internal blood flow present on color Doppler imaging. Left testicle Measurements: 3.4 x 2.0 x 3.2 cm. Normal morphology without mass or calcification. Internal blood flow present on color Doppler imaging, symmetric with RIGHT. Right epididymis:  Normal in size and appearance. Left epididymis:  Tiny cyst at the LEFT epididymal head 6 x 4 x 4 mm Hydrocele:   Small RIGHT hydrocele.  No LEFT hydrocele. Varicocele:  Absent bilaterally Pulsed Doppler interrogation of both testes demonstrates normal low resistance arterial and venous waveforms bilaterally. Additional At the site of palpable concern at the LEFT scrotum, a focal soft tissue swelling is identified with soft tissue heterogeneity and surrounding mild hypervascularity. A rounded area of complex hypoechogenicity is identified 14 x 9 x 15 mm in size. Appearance is nonspecific. IMPRESSION: Normal appearing testes without evidence of torsion or inflammatory process. Small RIGHT hydrocele. Small LEFT epididymal cyst. Complicated subcutaneous hypoechoic collection with surrounding heterogeneous soft tissue and hypervascularity at the area of lump on LEFT; areas nonspecific, question cellulitis or developing abscess, hematoma, or potentially an infected cystic collection such as an infected epidermal inclusion cyst or sebaceous cyst, though other etiologies are not excluded. Electronically Signed   By: Ulyses Southward M.D.   On: 02/26/2017 14:13   [place] Art/ven Flow Abd Pelv Doppler  Result Date: 02/26/2017 CLINICAL DATA:  LEFT testicular pain since Friday EXAM: SCROTAL ULTRASOUND DOPPLER ULTRASOUND OF THE TESTICLES TECHNIQUE: Complete ultrasound examination of the testicles, epididymis, and other scrotal structures was performed. Color and spectral Doppler ultrasound were also utilized to evaluate blood flow to the testicles. COMPARISON:  None FINDINGS: Right testicle Measurements: 3.8 x 1.8 x 2.9 cm. Normal morphology without mass or calcification. Internal blood flow present on color Doppler imaging. Left testicle Measurements: 3.4 x 2.0 x 3.2 cm. Normal morphology without mass or calcification. Internal blood flow present on color Doppler imaging, symmetric with RIGHT. Right epididymis:  Normal in size and appearance. Left epididymis:  Tiny cyst at the LEFT epididymal head 6 x 4 x 4 mm Hydrocele:  Small RIGHT hydrocele.   No LEFT hydrocele. Varicocele:  Absent bilaterally Pulsed Doppler interrogation of both testes demonstrates normal low resistance arterial and venous waveforms bilaterally. Additional At the site of palpable concern at the LEFT scrotum, a focal soft tissue swelling is identified with soft tissue heterogeneity and surrounding mild hypervascularity. A rounded area of complex hypoechogenicity is identified 14 x 9 x 15 mm in size. Appearance is nonspecific. IMPRESSION: Normal appearing testes without evidence of torsion or inflammatory process. Small  RIGHT hydrocele. Small LEFT epididymal cyst. Complicated subcutaneous hypoechoic collection with surrounding heterogeneous soft tissue and hypervascularity at the area of lump on LEFT; areas nonspecific, question cellulitis or developing abscess, hematoma, or potentially an infected cystic collection such as an infected epidermal inclusion cyst or sebaceous cyst, though other etiologies are not excluded. Electronically Signed   By: Ulyses SouthwardMark  Boles M.D.   On: 02/26/2017 14:13   Radiology No results found.  Procedures Procedures (including critical care time)  Medications Ordered in ED Medications  sodium chloride 0.9 % bolus 1,000 mL (not administered)     Initial Impression / Assessment and Plan / ED Course  I have reviewed the triage vital signs and the nursing notes.  Pertinent labs & imaging results that were available during my care of the patient were reviewed by me and considered in my medical decision making (see chart for details). Patient has history of paroxysmal atrial fibrillation. He formerly been on Coreg and diltiazem, he reports he was taken off of it by a physician due to side effects. His primary complaint is testicular pain.. I don't feel the patient needs emergent cardioversion. He has been hemodynamically stable throughout his ED course. I discussed atrial fibrillation with Dr. Elease HashimotoNahser who suggested metoprolol 25 mg twice daily and  follow-up in the office within the next few weeks. I also discussed her ultrasound patient with Dr. Annabell HowellsWrenn from urology service who suggested Rocephin 1 g IV prescription Bactrim. Also reports prescription for Percocet, as he has run out. He can be seen in the office of [facility] tomorrow.   [place] Controlled Substance reporting System queried  CHADSVASC SCORE=1  Final Clinical Impressions(s) / ED Diagnoses  Diagnosis #1 testicular pain #2 atrial fibrillation Final diagnoses:  None    New Prescriptions New Prescriptions   No medications on file     Doug SouJacubowitz, Saulo Anthis, MD 02/26/17 1711

## 2017-06-21 DIAGNOSIS — R6882 Decreased libido: Secondary | ICD-10-CM | POA: Insufficient documentation

## 2017-06-21 DIAGNOSIS — R42 Dizziness and giddiness: Secondary | ICD-10-CM | POA: Insufficient documentation

## 2017-06-21 DIAGNOSIS — E669 Obesity, unspecified: Secondary | ICD-10-CM | POA: Insufficient documentation

## 2017-06-21 DIAGNOSIS — R32 Unspecified urinary incontinence: Secondary | ICD-10-CM | POA: Insufficient documentation

## 2017-08-17 ENCOUNTER — Other Ambulatory Visit: Payer: Self-pay | Admitting: *Deleted

## 2017-08-17 ENCOUNTER — Encounter: Payer: Self-pay | Admitting: *Deleted

## 2017-08-17 NOTE — Progress Notes (Signed)
Cardiology Office Note:    Date:  08/18/2017   ID:  Darren Alexander, DOB [birth year], MRN [id number]  PCP:  Olive Bassough, Robert L, MD  Cardiologist:  Norman HerrlichBrian Rossie Bretado, MD   Referring MD: Olive Bassough, Robert L, MD  ASSESSMENT:    1. Abnormal EKG   2. Persistent atrial fibrillation (HCC)   3. Essential hypertension    PLAN:    In order of problems listed above:  1. He has an EKG pattern that is often a variant of normal in overlap with pathology I do not think the pattern is different compared to his previous EKGs and I can access and for certainty we will check a troponin today although his symptoms on 10/10/2017 were very vague and nonspecific and plan outpatient myocardial perfusion study. 2. Plan outpatient Holter monitor at this time I am not sure he requires any suppressive therapy and I would not initiate anticoagulation 3. Stable home blood pressures at target repeat blood pressure by me with a large cuff is 122/90.  He will continue his current treatment including nonrate limiting calcium channel blocker and ACE inhibitor.  Next appointment 4 weeks   Medication Adjustments/Labs and Tests Ordered: Current medicines are reviewed at length with the patient today.  Concerns regarding medicines are outlined above.  Orders Placed This Encounter  Procedures  . Troponin I  . Myocardial Perfusion Imaging  . Holter monitor - 24 hour   No orders of the defined types were placed in this encounter.    Chief Complaint  Patient presents with  . Irregular Heart Beat  . Atrial Fibrillation    my rate is rapid    History of Present Illness:    Darren Alexander is a 56 y.o. male with a PMH of gastric ulcer and GI bleed who is being seen today for the evaluation of atrial fibrillation present on EKG since June 2014 at the request of Olive Bassough, Robert L, MD.  EKG 08/16/17: Atrial fibrillation Possible Septal infarct , age undetermined When compared with ECG of 04-Apr-2016 09:14, Minor change in  QRS duration Septal infarct now present Confirmed by HAISTY, DR. Lacretia NicksW. K. ([id number]) on 08/16/2017 3:22:44 PM   Unfortunately his previous cardiology records have been seen Dr. Bing MatterKrasowski in 2015 not available.  At that time he was put on 3 drug suppression for atrial fibrillation beta-blocker calcium channel blocker digoxin and afterwards [place] he tells me he was in sinus rhythm and all the medications were stopped.   I am unable to access those records.  He also underwent testing through my previous practice including echocardiogram and stress test that were normal were trying to access those somewhere around the time of 2015.  He has an EKG in 2014 and this summer when he is in rate controlled atrial fibrillation.  He checks heart rate and blood pressure at home and his rates at rest usually run in the range of 90-100 bpm.  He has no palpitation he is vigorous and active and has no specific symptoms of atrial fibrillation except he is breathless with moderate or greater exertion not severe and not limiting.  He has had no edema chest pain syncope TIA.  2 days ago he did not feel well he was lightheaded dizzy week and presented to his PCP office and was found to be in rate controlled atrial fibrillation his EKG showed a pattern of possible septal infarction similar to what I see in previous EKGs down to 2014 but  was read as a new anterior septal MI.  He has had no clinical event to correlate with the.  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  Regarding atrial fibrillation has had a previous extensive evaluation occluding echocardiogram stress test thyroid studies and I need to access those records.  I think he is high risk to anticoagulate with a chads 2 Vasc of 1 history of ulcer disease previous GI bleed and concurrent therapy with nonsteroidal anti-inflammatory drug.  With this paucity of  symptoms I would not raise the use of antiarrhythmic drugs cardioversion referral for EP catheter ablation.  I think it safe for him to continue his nonsteroidal anti-inflammatory drug if appropriate he is on a PPI he has had no recurrent bleeding and I do not think it plays a role in his arrhythmia.  I am uncertain whether he requires any suppressive therapy for his atrial fibrillation and will utilize a outpatient Holter monitor for 24 hours and if suppression is needed I would give him a single agent.  Past Medical History:  Diagnosis Date  . Erectile dysfunction 05/10/2016  . Gastric ulcer   . 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 HTN (hypertension)   . Iron deficiency anemia 04/06/2016  . Obesity   . Onychomycosis 04/23/2014  . OSA (obstructive sleep apnea) 02/15/2016  . PAF (paroxysmal atrial fibrillation) (HCC)   . Prediabetes 05/08/2016    Past Surgical History:  Procedure Laterality Date  . I&D EXTREMITY Bilateral 02/20/2013   Procedure: IRRIGATION AND DEBRIDEMENT bilateral lower legs ;  Surgeon: Budd PalmerMichael H Handy, MD;  [facility];  Service: Orthopedics;  Laterality: Bilateral;  . ORIF Left humerus  2012    Current Medications: Current Meds  Medication Sig  . omeprazole (PRILOSEC) 40 MG capsule Take by mouth.  . sildenafil (REVATIO) 20 MG tablet Take by mouth.  . zolpidem (AMBIEN) 5 MG tablet Take 5 mg by mouth at bedtime as needed for sleep.     Allergies:   Patient has no known allergies.   Social History   Socioeconomic History  . Marital status: Divorced    Spouse name: None  . Number of children: None  . Years of education: None  . Highest education level: None  Social Needs  . Financial resource strain: None  . Food insecurity - worry: None  . Food insecurity - inability: None  . Transportation needs - medical: None  . Transportation needs - non-medical: None  Occupational History  . None  Tobacco Use  . Smoking status: Never Smoker  . Smokeless tobacco: Never  Used  Substance and Sexual Activity  . Alcohol use: No  . Drug use: No  . Sexual activity: None  Other Topics Concern  . None  Social History Narrative  . None     Family History: He has no family history of atrial fibrillation ROS:   Review of Systems  Constitution: Positive for weakness (2 days ago).  HENT: Negative.   Eyes: Negative.   Cardiovascular: Positive for dyspnea on exertion (moderate or greater activity). Negative for chest pain, claudication, cyanosis, irregular heartbeat, leg swelling, near-syncope, orthopnea, palpitations and paroxysmal nocturnal dyspnea.  Respiratory: Positive for shortness of breath.   Skin: Negative.   Musculoskeletal: Positive for joint pain.  Gastrointestinal: Negative.   Genitourinary: Negative.   Neurological: Positive for dizziness (2 days ago).  Psychiatric/Behavioral: Negative.   Allergic/Immunologic: Negative.    Please  see the history of present illness.     All other systems reviewed and are negative.  EKGs/Labs/Other Studies Reviewed:    The following studies were reviewed today   EKG: from 08/16/17 independently reviewed EKG: 08/16/17 AF controlled rate Recent Labs: CBC TSH normal 02/26/2017: BUN 11; Creatinine, Ser 0.70; Hemoglobin 18.0; Platelets 182; Potassium 4.2; Sodium 138  Recent Lipid Panel Chol 124, HDL 31 LDL 82   Physical Exam:    VS:  BP (!) 124/100 (BP Location: Left Arm, Patient Position: Sitting, Cuff Size: Normal)   Pulse 93   Ht 6\' 2"  (1.88 m)   Wt 296 lb (134.3 kg)   SpO2 98%   BMI 38.00 kg/m     Wt Readings from Last 3 Encounters:  08/18/17 296 lb (134.3 kg)  02/26/17 282 lb (127.9 kg)     GEN:  Well nourished, well developed in no acute distress HEENT: Normal NECK: No JVD; No carotid bruits LYMPHATICS: No lymphadenopathy CARDIAC: IrrIrr variable S1 no murmurs, rubs, gallops RESPIRATORY:  Clear to auscultation without rales, wheezing or rhonchi  ABDOMEN: Soft, non-tender,  non-distended MUSCULOSKELETAL:  No edema; No deformity  SKIN: Warm and dry NEUROLOGIC:  Alert and oriented x 3 PSYCHIATRIC:  Normal affect     Signed, Norman Herrlich, MD  08/18/2017 9:57 AM    [facility]

## 2017-08-18 ENCOUNTER — Encounter: Payer: Self-pay | Admitting: Cardiology

## 2017-08-18 ENCOUNTER — Ambulatory Visit: Payer: Commercial Managed Care - PPO | Admitting: Cardiology

## 2017-08-18 VITALS — BP 124/100 | HR 93 | Ht 74.0 in | Wt 296.0 lb

## 2017-08-18 DIAGNOSIS — I481 Persistent atrial fibrillation: Secondary | ICD-10-CM

## 2017-08-18 DIAGNOSIS — I10 Essential (primary) hypertension: Secondary | ICD-10-CM | POA: Diagnosis not present

## 2017-08-18 DIAGNOSIS — R9431 Abnormal electrocardiogram [ECG] [EKG]: Secondary | ICD-10-CM

## 2017-08-18 DIAGNOSIS — I4819 Other persistent atrial fibrillation: Secondary | ICD-10-CM

## 2017-08-18 NOTE — Patient Instructions (Signed)
Medication Instructions:  Your physician recommends that you continue on your current medications as directed. Please refer to the Current Medication list given to you today.  Labwork: Your physician recommends that you return for lab work today. Troponin  Testing/Procedures: Your physician has recommended that you wear a holter monitor. Holter monitors are medical devices that record the heart's electrical activity. Doctors most often use these monitors to diagnose arrhythmias. Arrhythmias are problems with the speed or rhythm of the heartbeat. The monitor is a small, portable device. You can wear one while you do your normal daily activities. This is usually used to diagnose what is causing palpitations/syncope (passing out).  Your physician has requested that you have en exercise stress myoview. For further information please visit [email or web address]. Please follow instruction sheet, as given.  Follow-Up: Your physician recommends that you schedule a follow-up appointment in: 4 weeks.  Any Other Special Instructions Will Be Listed Below (If Applicable).     If you need a refill on your cardiac medications before your next appointment, please call your pharmacy.

## 2017-08-19 LAB — TROPONIN I: Troponin I: <0.01 ng/mL (ref 0.00–0.04)

## 2017-08-30 ENCOUNTER — Ambulatory Visit (HOSPITAL_COMMUNITY): Payer: Commercial Managed Care - PPO | Attending: Cardiovascular Disease

## 2017-08-30 DIAGNOSIS — R9431 Abnormal electrocardiogram [ECG] [EKG]: Secondary | ICD-10-CM | POA: Insufficient documentation

## 2017-08-30 DIAGNOSIS — R0600 Dyspnea, unspecified: Secondary | ICD-10-CM

## 2017-08-30 MED ORDER — REGADENOSON 0.4 MG/5ML IV SOLN
0.4000 mg | Freq: Once | INTRAVENOUS | Status: AC
  Administered 2017-08-30: 0.4 mg via INTRAVENOUS

## 2017-08-30 MED ORDER — TECHNETIUM TC 99M TETROFOSMIN IV KIT
1230.0000 | PACK | Freq: Once | INTRAVENOUS | Status: AC | PRN
  Administered 2017-08-30: 1230 via INTRAVENOUS
  Filled 2017-08-30: qty 1230

## 2017-08-31 ENCOUNTER — Ambulatory Visit (INDEPENDENT_AMBULATORY_CARE_PROVIDER_SITE_OTHER): Payer: Commercial Managed Care - PPO

## 2017-08-31 ENCOUNTER — Ambulatory Visit (HOSPITAL_COMMUNITY): Payer: Commercial Managed Care - PPO | Attending: Cardiology

## 2017-08-31 DIAGNOSIS — I10 Essential (primary) hypertension: Secondary | ICD-10-CM | POA: Diagnosis not present

## 2017-08-31 DIAGNOSIS — R9431 Abnormal electrocardiogram [ECG] [EKG]: Secondary | ICD-10-CM | POA: Diagnosis not present

## 2017-08-31 DIAGNOSIS — I4819 Other persistent atrial fibrillation: Secondary | ICD-10-CM

## 2017-08-31 DIAGNOSIS — I481 Persistent atrial fibrillation: Secondary | ICD-10-CM

## 2017-08-31 LAB — MYOCARDIAL PERFUSION IMAGING
CHL CUP NUCLEAR SRS: 2
CHL CUP NUCLEAR SSS: 3
CHL CUP RESTING HR STRESS: 109 {beats}/min
CSEPPHR: 126 {beats}/min
LVDIAVOL: 94 mL (ref 62–150)
LVSYSVOL: 34 mL
RATE: 0.4
SDS: 1
TID: 1.09

## 2017-08-31 MED ORDER — TECHNETIUM TC 99M TETROFOSMIN IV KIT
31.9000 | PACK | Freq: Once | INTRAVENOUS | Status: AC | PRN
  Administered 2017-08-31: 31.9 via INTRAVENOUS
  Filled 2017-08-31: qty 32

## 2017-09-08 NOTE — Progress Notes (Signed)
Cardiology Office Note:    Date:  09/11/2017   ID:  Darren Alexander, DOB 1961/08/07, MRN [id number]  PCP:  Olive Bass, MD  Cardiologist:  Norman Herrlich, MD    Referring MD: Olive Bass, MD    ASSESSMENT:    1. Persistent atrial fibrillation (HCC)   2. Hypertensive heart disease without heart failure    PLAN:    In order of problems listed above:  1. Stable he has good heart rate control on a beta-blocker and at this time continue aspirin with a chads 2 Vasc of 1 and a history of ulcer with GI bleed.   2. Stable continue current medical treatment    Next appointment: 6 months   Medication Adjustments/Labs and Tests Ordered: Current medicines are reviewed at length with the patient today.  Concerns regarding medicines are outlined above.  No orders of the defined types were placed in this encounter.  No orders of the defined types were placed in this encounter.   Chief Complaint  Patient presents with  . Follow-up  . Atrial Fibrillation    History of Present Illness:    Darren Alexander is a 57 y.o. male with a hx of hypertension, persistent atrial fibrillation, abnormal EKG with ? ASMI  and gastric ulcer and GI bleed  last seen .  08/18/2017 Compliance with diet, lifestyle and medications: Yes he is done well no palpitation exercise intolerance chest pain syncope or TIA. Results of myocardial perfusion study normal and Holter monitor with adequate heart rate control reviewed with the patient Past Medical History:  Diagnosis Date  . Erectile dysfunction 05/10/2016  . Gastric ulcer   . 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 HTN (hypertension)   . Iron deficiency anemia 04/06/2016  . Obesity   . Onychomycosis 04/23/2014  . OSA (obstructive sleep apnea) 02/15/2016  . PAF (paroxysmal atrial fibrillation) (HCC)   . Prediabetes 05/08/2016    Past Surgical History:  Procedure Laterality Date  . I&D EXTREMITY Bilateral 02/20/2013   Procedure: IRRIGATION AND DEBRIDEMENT  bilateral lower legs ;  Surgeon: Budd Palmer, MD;  Location: [facility] OR;  Service: Orthopedics;  Laterality: Bilateral;  . ORIF Left humerus  2012    Current Medications: Current Meds  Medication Sig  . aspirin 81 MG EC tablet Take 1 tablet (81 mg total) by mouth daily. Swallow whole.  . metoprolol succinate (TOPROL-XL) 50 MG 24 hr tablet Take by mouth.  Marland Kitchen omeprazole (PRILOSEC) 40 MG capsule Take by mouth.  . sildenafil (REVATIO) 20 MG tablet Take by mouth.  . zolpidem (AMBIEN) 5 MG tablet Take 5 mg by mouth at bedtime as needed for sleep.     Allergies:   Patient has no known allergies.   Social History   Socioeconomic History  . Marital status: Divorced    Spouse name: None  . Number of children: None  . Years of education: None  . Highest education level: None  Social Needs  . Financial resource strain: None  . Food insecurity - worry: None  . Food insecurity - inability: None  . Transportation needs - medical: None  . Transportation needs - non-medical: None  Occupational History  . None  Tobacco Use  . Smoking status: Never Smoker  . Smokeless tobacco: Never Used  Substance and Sexual Activity  . Alcohol use: No  . Drug use: No  . Sexual activity: None  Other Topics Concern  . None  Social History Narrative  . None  Family History: The patient's family history is not on file. ROS:   Please see the history of present illness.    All other systems reviewed and are negative.  EKGs/Labs/Other Studies Reviewed:    The following studies were reviewed today:  Holter monitor: Ordering physician:       Dr. Dulce SellarMunley Interpreting physician:   Dr. Dulce SellarMunley  Baseline rhythm: Atrial fibrillation 100% of the recording Minimum heart rate 71 average heart rate 104 maximal heart rate 164 bpm Atrial arrhythmia: Atrial fibrillation throughout with rapid ventricular rates Ventricular arrhythmia: Rare, 17 PVCs no couplets or episodes of ventricular tachycardia.  Many of these  appear to be typical aberrant conduction Conduction abnormality: None, longest R to R pause 1.4 seconds QT interval: Normal corrected 4-6 average Symptoms: None Conclusion: Atrial fibrillation with rapid ventricular rate  MPI: Study Highlights    Nuclear stress EF: 60%.  No T wave inversion was noted during stress.  There was no ST segment deviation noted during stress.  This is a low risk study.  Baseline ECG indicates atrial fibrillation    Recent Labs: 02/26/2017: BUN 11; Creatinine, Ser 0.70; Hemoglobin 18.0; Platelets 182; Potassium 4.2; Sodium 138  Recent Lipid Panel No results found for: CHOL, TRIG, HDL, CHOLHDL, VLDL, LDLCALC, LDLDIRECT  Physical Exam:    VS:  BP 110/80   Pulse (!) 104   Ht 6\' 2"  (1.88 m)   Wt 298 lb (135.2 kg)   SpO2 98%   BMI 38.26 kg/m     Wt Readings from Last 3 Encounters:  09/11/17 298 lb (135.2 kg)  08/18/17 296 lb (134.3 kg)  02/26/17 282 lb (127.9 kg)     GEN:  Well nourished, well developed in no acute distress HEENT: Normal NECK: No JVD; No carotid bruits LYMPHATICS: No lymphadenopathy CARDIAC: Irrr Irr variable s1  no murmurs, rubs, gallops RESPIRATORY:  Clear to auscultation without rales, wheezing or rhonchi  ABDOMEN: Soft, non-tender, non-distended MUSCULOSKELETAL:  No edema; No deformity  SKIN: Warm and dry NEUROLOGIC:  Alert and oriented x 3 PSYCHIATRIC:  Normal affect    Signed, Norman HerrlichBrian Munley, MD  09/11/2017 4:25 PM    [facility]

## 2017-09-11 ENCOUNTER — Encounter: Payer: Self-pay | Admitting: Cardiology

## 2017-09-11 ENCOUNTER — Ambulatory Visit: Payer: Commercial Managed Care - PPO | Admitting: Cardiology

## 2017-09-11 VITALS — BP 110/80 | HR 104 | Ht 74.0 in | Wt 298.0 lb

## 2017-09-11 DIAGNOSIS — I4819 Other persistent atrial fibrillation: Secondary | ICD-10-CM

## 2017-09-11 DIAGNOSIS — I119 Hypertensive heart disease without heart failure: Secondary | ICD-10-CM | POA: Diagnosis not present

## 2017-09-11 DIAGNOSIS — I481 Persistent atrial fibrillation: Secondary | ICD-10-CM

## 2017-09-11 NOTE — Patient Instructions (Signed)

## 2017-09-13 ENCOUNTER — Ambulatory Visit: Payer: Self-pay | Admitting: Cardiology

## 2017-09-19 ENCOUNTER — Ambulatory Visit: Payer: Self-pay | Admitting: Cardiology

## 2018-02-13 DIAGNOSIS — Z13220 Encounter for screening for lipoid disorders: Secondary | ICD-10-CM | POA: Diagnosis not present

## 2018-02-13 DIAGNOSIS — R7302 Impaired glucose tolerance (oral): Secondary | ICD-10-CM | POA: Diagnosis not present

## 2018-02-13 DIAGNOSIS — I10 Essential (primary) hypertension: Secondary | ICD-10-CM | POA: Diagnosis not present

## 2018-02-13 DIAGNOSIS — R7303 Prediabetes: Secondary | ICD-10-CM | POA: Diagnosis not present

## 2018-02-23 IMAGING — NM NM MISC PROCEDURE
6 series · 36 of 36 positions shown · non-contrast
Comparison: none

[Series 1: stress - gated · 6.51mm/px · 6 of 512 frames shown]
[frame 43/512]
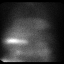
[frame 128/512]
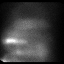
[frame 214/512]
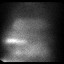
[frame 299/512]
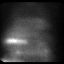
[frame 384/512]
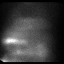
[frame 470/512]
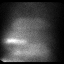

[Series 1: wbr_s-proj_st stress - gated · 6.51mm/px · 6 of 512 frames shown]
[frame 43/512]
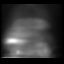
[frame 128/512]
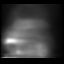
[frame 214/512]
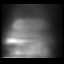
[frame 299/512]
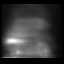
[frame 384/512]
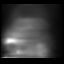
[frame 470/512]
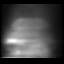

[Series 2: wbr_s-proj_st stress - perfusion · 6.51mm/px · 6 of 64 frames shown]
[frame 6/64]
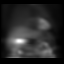
[frame 16/64]
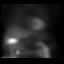
[frame 27/64]
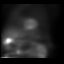
[frame 38/64]
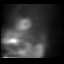
[frame 48/64]
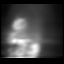
[frame 59/64]
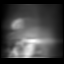

[Series 2: stress - perfusion · 6.51mm/px · 6 of 64 frames shown]
[frame 6/64]
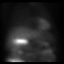
[frame 16/64]
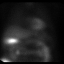
[frame 27/64]
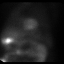
[frame 38/64]
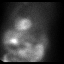
[frame 48/64]
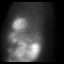
[frame 59/64]
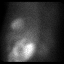

[Series 3: wbr_r-proj_st rest · 6.51mm/px · 6 of 64 frames shown]
[frame 6/64]
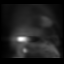
[frame 16/64]
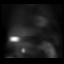
[frame 27/64]
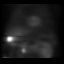
[frame 38/64]
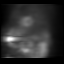
[frame 48/64]
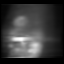
[frame 59/64]
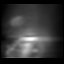

[Series 3: rest · 6.51mm/px · 6 of 64 frames shown]
[frame 6/64]
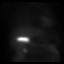
[frame 16/64]
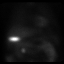
[frame 27/64]
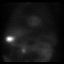
[frame 38/64]
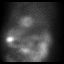
[frame 48/64]
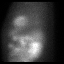
[frame 59/64]
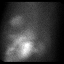

[36 of 36 positions shown; findings below may reference images not displayed]

Canned report from images found in remote index.

Refer to host system for actual result text.

## 2018-07-12 DIAGNOSIS — J0100 Acute maxillary sinusitis, unspecified: Secondary | ICD-10-CM | POA: Diagnosis not present

## 2019-11-11 DIAGNOSIS — A419 Sepsis, unspecified organism: Secondary | ICD-10-CM

## 2019-11-11 DIAGNOSIS — D72825 Bandemia: Secondary | ICD-10-CM | POA: Diagnosis not present

## 2019-11-11 DIAGNOSIS — D72829 Elevated white blood cell count, unspecified: Secondary | ICD-10-CM | POA: Diagnosis not present

## 2019-11-11 DIAGNOSIS — R197 Diarrhea, unspecified: Secondary | ICD-10-CM | POA: Diagnosis not present

## 2019-11-12 DIAGNOSIS — R197 Diarrhea, unspecified: Secondary | ICD-10-CM | POA: Diagnosis not present

## 2019-11-12 DIAGNOSIS — A419 Sepsis, unspecified organism: Secondary | ICD-10-CM | POA: Diagnosis not present

## 2019-11-12 DIAGNOSIS — D72825 Bandemia: Secondary | ICD-10-CM | POA: Diagnosis not present

## 2019-11-12 DIAGNOSIS — D72829 Elevated white blood cell count, unspecified: Secondary | ICD-10-CM | POA: Diagnosis not present

## 2019-11-13 DIAGNOSIS — D72829 Elevated white blood cell count, unspecified: Secondary | ICD-10-CM | POA: Diagnosis not present

## 2019-11-13 DIAGNOSIS — D72825 Bandemia: Secondary | ICD-10-CM | POA: Diagnosis not present

## 2019-11-13 DIAGNOSIS — A419 Sepsis, unspecified organism: Secondary | ICD-10-CM | POA: Diagnosis not present

## 2019-11-13 DIAGNOSIS — R197 Diarrhea, unspecified: Secondary | ICD-10-CM | POA: Diagnosis not present

## 2019-11-14 DIAGNOSIS — R197 Diarrhea, unspecified: Secondary | ICD-10-CM | POA: Diagnosis not present

## 2019-11-14 DIAGNOSIS — D72829 Elevated white blood cell count, unspecified: Secondary | ICD-10-CM | POA: Diagnosis not present

## 2019-11-14 DIAGNOSIS — A419 Sepsis, unspecified organism: Secondary | ICD-10-CM | POA: Diagnosis not present

## 2019-11-14 DIAGNOSIS — D72825 Bandemia: Secondary | ICD-10-CM | POA: Diagnosis not present

## 2019-11-15 DIAGNOSIS — D72829 Elevated white blood cell count, unspecified: Secondary | ICD-10-CM | POA: Diagnosis not present

## 2019-11-15 DIAGNOSIS — A419 Sepsis, unspecified organism: Secondary | ICD-10-CM | POA: Diagnosis not present

## 2019-11-15 DIAGNOSIS — R197 Diarrhea, unspecified: Secondary | ICD-10-CM | POA: Diagnosis not present

## 2019-11-15 DIAGNOSIS — D72825 Bandemia: Secondary | ICD-10-CM | POA: Diagnosis not present

## 2023-02-21 ENCOUNTER — Encounter (HOSPITAL_COMMUNITY): Payer: Self-pay

## 2023-02-21 ENCOUNTER — Observation Stay (HOSPITAL_COMMUNITY)
Admission: EM | Admit: 2023-02-21 | Discharge: 2023-02-24 | DRG: 871 | Disposition: A | Discharge status: Home / Self Care | Payer: Commercial Managed Care - PPO | Attending: Internal Medicine | Admitting: Internal Medicine

## 2023-02-21 DIAGNOSIS — A419 Sepsis, unspecified organism: Principal | ICD-10-CM | POA: Diagnosis present

## 2023-02-21 DIAGNOSIS — L03116 Cellulitis of left lower limb: Secondary | ICD-10-CM | POA: Diagnosis not present

## 2023-02-21 DIAGNOSIS — R748 Abnormal levels of other serum enzymes: Secondary | ICD-10-CM | POA: Diagnosis present

## 2023-02-21 DIAGNOSIS — D751 Secondary polycythemia: Secondary | ICD-10-CM | POA: Diagnosis present

## 2023-02-21 DIAGNOSIS — R0609 Other forms of dyspnea: Secondary | ICD-10-CM | POA: Diagnosis present

## 2023-02-21 DIAGNOSIS — Z79899 Other long term (current) drug therapy: Secondary | ICD-10-CM | POA: Diagnosis not present

## 2023-02-21 DIAGNOSIS — I509 Heart failure, unspecified: Secondary | ICD-10-CM

## 2023-02-21 DIAGNOSIS — I110 Hypertensive heart disease with heart failure: Secondary | ICD-10-CM | POA: Diagnosis present

## 2023-02-21 DIAGNOSIS — I4819 Other persistent atrial fibrillation: Secondary | ICD-10-CM | POA: Diagnosis not present

## 2023-02-21 DIAGNOSIS — G4733 Obstructive sleep apnea (adult) (pediatric): Secondary | ICD-10-CM | POA: Diagnosis present

## 2023-02-21 DIAGNOSIS — I160 Hypertensive urgency: Secondary | ICD-10-CM | POA: Diagnosis present

## 2023-02-21 DIAGNOSIS — Z8711 Personal history of peptic ulcer disease: Secondary | ICD-10-CM | POA: Diagnosis not present

## 2023-02-21 DIAGNOSIS — Z6838 Body mass index (BMI) 38.0-38.9, adult: Secondary | ICD-10-CM

## 2023-02-21 DIAGNOSIS — I5033 Acute on chronic diastolic (congestive) heart failure: Secondary | ICD-10-CM | POA: Diagnosis not present

## 2023-02-21 DIAGNOSIS — I480 Paroxysmal atrial fibrillation: Secondary | ICD-10-CM | POA: Diagnosis present

## 2023-02-21 DIAGNOSIS — I4891 Unspecified atrial fibrillation: Secondary | ICD-10-CM

## 2023-02-21 DIAGNOSIS — Z7982 Long term (current) use of aspirin: Secondary | ICD-10-CM | POA: Diagnosis not present

## 2023-02-21 DIAGNOSIS — E669 Obesity, unspecified: Secondary | ICD-10-CM | POA: Diagnosis present

## 2023-02-21 LAB — BASIC METABOLIC PANEL
Anion gap: 9 (ref 5–15)
BUN: 15 mg/dL (ref 8–23)
CO2: 21 mmol/L — ABNORMAL LOW (ref 22–32)
Calcium: 8.6 mg/dL — ABNORMAL LOW (ref 8.9–10.3)
Chloride: 104 mmol/L (ref 98–111)
Creatinine, Ser: 0.77 mg/dL (ref 0.61–1.24)
GFR, Estimated: >60 mL/min (ref >60)
Glucose, Bld: 107 mg/dL — ABNORMAL HIGH (ref 70–99)
Potassium: 4.2 mmol/L (ref 3.5–5.1)
Sodium: 134 mmol/L — ABNORMAL LOW (ref 135–145)

## 2023-02-21 LAB — CBC
HCT: 52.2 % — ABNORMAL HIGH (ref 39.0–52.0)
Hemoglobin: 17.4 g/dL — ABNORMAL HIGH (ref 13.0–17.0)
MCH: 30.1 pg (ref 26.0–34.0)
MCHC: 33.3 g/dL (ref 30.0–36.0)
MCV: 90.2 fL (ref 80.0–100.0)
Platelets: 155 10*3/uL (ref 150–400)
RBC: 5.79 MIL/uL (ref 4.22–5.81)
RDW: 13.6 % (ref 11.5–15.5)
WBC: 16.7 10*3/uL — ABNORMAL HIGH (ref 4.0–10.5)
nRBC: 0 % (ref 0.0–0.2)

## 2023-02-21 LAB — CBG MONITORING, ED: Glucose-Capillary: 117 mg/dL — ABNORMAL HIGH (ref 70–99)

## 2023-02-21 NOTE — ED Triage Notes (Signed)
Has had general weakness since today at noon, states he has trouble lifting object he would have normally be able to before like a 40lb bag of dog food. Has some shortness of breath, has been tired all afternoon. States he has a Hx of gastric ulcer but no blood coming out of stool and no nausea or vomiting.

## 2023-02-22 ENCOUNTER — Emergency Department (HOSPITAL_COMMUNITY): Payer: Commercial Managed Care - PPO

## 2023-02-22 ENCOUNTER — Other Ambulatory Visit: Payer: Self-pay

## 2023-02-22 ENCOUNTER — Inpatient Hospital Stay (HOSPITAL_COMMUNITY): Payer: Commercial Managed Care - PPO

## 2023-02-22 DIAGNOSIS — A419 Sepsis, unspecified organism: Secondary | ICD-10-CM | POA: Diagnosis present

## 2023-02-22 DIAGNOSIS — G4733 Obstructive sleep apnea (adult) (pediatric): Secondary | ICD-10-CM | POA: Diagnosis present

## 2023-02-22 DIAGNOSIS — I480 Paroxysmal atrial fibrillation: Secondary | ICD-10-CM

## 2023-02-22 DIAGNOSIS — R0609 Other forms of dyspnea: Secondary | ICD-10-CM | POA: Diagnosis present

## 2023-02-22 DIAGNOSIS — Z7982 Long term (current) use of aspirin: Secondary | ICD-10-CM | POA: Diagnosis not present

## 2023-02-22 DIAGNOSIS — D751 Secondary polycythemia: Secondary | ICD-10-CM | POA: Diagnosis present

## 2023-02-22 DIAGNOSIS — M7989 Other specified soft tissue disorders: Secondary | ICD-10-CM | POA: Diagnosis not present

## 2023-02-22 DIAGNOSIS — Z6838 Body mass index (BMI) 38.0-38.9, adult: Secondary | ICD-10-CM | POA: Diagnosis not present

## 2023-02-22 DIAGNOSIS — Z8711 Personal history of peptic ulcer disease: Secondary | ICD-10-CM

## 2023-02-22 DIAGNOSIS — R748 Abnormal levels of other serum enzymes: Secondary | ICD-10-CM | POA: Diagnosis present

## 2023-02-22 DIAGNOSIS — I160 Hypertensive urgency: Secondary | ICD-10-CM | POA: Diagnosis present

## 2023-02-22 DIAGNOSIS — L03116 Cellulitis of left lower limb: Secondary | ICD-10-CM

## 2023-02-22 DIAGNOSIS — I4891 Unspecified atrial fibrillation: Secondary | ICD-10-CM | POA: Diagnosis not present

## 2023-02-22 DIAGNOSIS — E669 Obesity, unspecified: Secondary | ICD-10-CM

## 2023-02-22 DIAGNOSIS — I4819 Other persistent atrial fibrillation: Secondary | ICD-10-CM | POA: Diagnosis present

## 2023-02-22 DIAGNOSIS — I5033 Acute on chronic diastolic (congestive) heart failure: Secondary | ICD-10-CM | POA: Diagnosis present

## 2023-02-22 DIAGNOSIS — I509 Heart failure, unspecified: Secondary | ICD-10-CM | POA: Diagnosis not present

## 2023-02-22 DIAGNOSIS — I110 Hypertensive heart disease with heart failure: Secondary | ICD-10-CM | POA: Diagnosis present

## 2023-02-22 DIAGNOSIS — Z79899 Other long term (current) drug therapy: Secondary | ICD-10-CM | POA: Diagnosis not present

## 2023-02-22 LAB — ECHOCARDIOGRAM COMPLETE
AR max vel: 3.29 cm2
AV Peak grad: 6.8 mmHg
Ao pk vel: 1.3 m/s
Area-P 1/2: 5.13 cm2
Height: 74 in
S' Lateral: 3.1 cm
Weight: 4768.99 oz

## 2023-02-22 LAB — TROPONIN I (HIGH SENSITIVITY)
Troponin I (High Sensitivity): 7 ng/L (ref <18)
Troponin I (High Sensitivity): 7 ng/L (ref <18)

## 2023-02-22 LAB — URINALYSIS, ROUTINE W REFLEX MICROSCOPIC
Bilirubin Urine: NEGATIVE
Glucose, UA: NEGATIVE mg/dL
Hgb urine dipstick: NEGATIVE
Ketones, ur: 5 mg/dL — AB
Leukocytes,Ua: NEGATIVE
Nitrite: NEGATIVE
Protein, ur: NEGATIVE mg/dL
Specific Gravity, Urine: 1.017 (ref 1.005–1.030)
pH: 5 (ref 5.0–8.0)

## 2023-02-22 LAB — C-REACTIVE PROTEIN: CRP: 5.1 mg/dL — ABNORMAL HIGH (ref <1.0)

## 2023-02-22 LAB — HIV ANTIBODY (ROUTINE TESTING W REFLEX): HIV Screen 4th Generation wRfx: NONREACTIVE

## 2023-02-22 LAB — SEDIMENTATION RATE: Sed Rate: 12 mm/hr (ref 0–16)

## 2023-02-22 LAB — BRAIN NATRIURETIC PEPTIDE: B Natriuretic Peptide: 167.7 pg/mL — ABNORMAL HIGH (ref 0.0–100.0)

## 2023-02-22 LAB — HEPATIC FUNCTION PANEL
ALT: 48 U/L — ABNORMAL HIGH (ref 0–44)
AST: 54 U/L — ABNORMAL HIGH (ref 15–41)
Albumin: 3.4 g/dL — ABNORMAL LOW (ref 3.5–5.0)
Alkaline Phosphatase: 104 U/L (ref 38–126)
Bilirubin, Direct: 0.5 mg/dL — ABNORMAL HIGH (ref 0.0–0.2)
Indirect Bilirubin: 1.3 mg/dL — ABNORMAL HIGH (ref 0.3–0.9)
Total Bilirubin: 1.8 mg/dL — ABNORMAL HIGH (ref 0.3–1.2)
Total Protein: 7.2 g/dL (ref 6.5–8.1)

## 2023-02-22 LAB — LACTIC ACID, PLASMA: Lactic Acid, Venous: 1.3 mmol/L (ref 0.5–1.9)

## 2023-02-22 LAB — MAGNESIUM: Magnesium: 1.9 mg/dL (ref 1.7–2.4)

## 2023-02-22 LAB — D-DIMER, QUANTITATIVE: D-Dimer, Quant: 0.49 ug/mL-FEU (ref 0.00–0.50)

## 2023-02-22 MED ORDER — SODIUM CHLORIDE 0.9% FLUSH
3.0000 mL | Freq: Two times a day (BID) | INTRAVENOUS | Status: DC
  Administered 2023-02-22 – 2023-02-24 (×5): 3 mL via INTRAVENOUS

## 2023-02-22 MED ORDER — CEFAZOLIN SODIUM-DEXTROSE 2-4 GM/100ML-% IV SOLN
2.0000 g | Freq: Three times a day (TID) | INTRAVENOUS | Status: DC
  Administered 2023-02-22 – 2023-02-24 (×6): 2 g via INTRAVENOUS
  Filled 2023-02-22 (×6): qty 100

## 2023-02-22 MED ORDER — METOPROLOL SUCCINATE ER 50 MG PO TB24
50.0000 mg | ORAL_TABLET | Freq: Every day | ORAL | Status: DC
  Administered 2023-02-22 – 2023-02-24 (×3): 50 mg via ORAL
  Filled 2023-02-22: qty 2
  Filled 2023-02-22 (×2): qty 1

## 2023-02-22 MED ORDER — ASPIRIN 81 MG PO TBEC
81.0000 mg | DELAYED_RELEASE_TABLET | Freq: Every day | ORAL | Status: DC
  Administered 2023-02-22 – 2023-02-24 (×3): 81 mg via ORAL
  Filled 2023-02-22 (×3): qty 1

## 2023-02-22 MED ORDER — PANTOPRAZOLE SODIUM 40 MG PO TBEC
40.0000 mg | DELAYED_RELEASE_TABLET | Freq: Every day | ORAL | Status: DC
  Administered 2023-02-22 – 2023-02-24 (×3): 40 mg via ORAL
  Filled 2023-02-22 (×3): qty 1

## 2023-02-22 MED ORDER — FUROSEMIDE 10 MG/ML IJ SOLN: 40.0000 mg | Freq: Two times a day (BID) | INTRAMUSCULAR | Status: DC

## 2023-02-22 MED ORDER — ACETAMINOPHEN 650 MG RE SUPP: 650.0000 mg | Freq: Four times a day (QID) | RECTAL | Status: DC | PRN

## 2023-02-22 MED ORDER — HYDRALAZINE HCL 20 MG/ML IJ SOLN: 10.0000 mg | INTRAMUSCULAR | Status: DC | PRN

## 2023-02-22 MED ORDER — ACETAMINOPHEN 325 MG PO TABS
650.0000 mg | ORAL_TABLET | Freq: Four times a day (QID) | ORAL | Status: DC | PRN
  Administered 2023-02-23: 650 mg via ORAL
  Filled 2023-02-22: qty 2

## 2023-02-22 MED ORDER — CALCIUM GLUCONATE-NACL 1-0.675 GM/50ML-% IV SOLN
1.0000 g | Freq: Once | INTRAVENOUS | Status: AC
  Administered 2023-02-22: 1000 mg via INTRAVENOUS
  Filled 2023-02-22: qty 50

## 2023-02-22 MED ORDER — FUROSEMIDE 10 MG/ML IJ SOLN
40.0000 mg | Freq: Two times a day (BID) | INTRAMUSCULAR | Status: AC
  Administered 2023-02-22 (×2): 40 mg via INTRAVENOUS
  Filled 2023-02-22 (×2): qty 4

## 2023-02-22 MED ORDER — CEFAZOLIN SODIUM-DEXTROSE 2-4 GM/100ML-% IV SOLN
2.0000 g | Freq: Once | INTRAVENOUS | Status: AC
  Administered 2023-02-22: 2 g via INTRAVENOUS
  Filled 2023-02-22: qty 100

## 2023-02-22 MED ORDER — ENOXAPARIN SODIUM 40 MG/0.4ML IJ SOSY
40.0000 mg | PREFILLED_SYRINGE | Freq: Every day | INTRAMUSCULAR | Status: DC
  Administered 2023-02-22 – 2023-02-24 (×3): 40 mg via SUBCUTANEOUS
  Filled 2023-02-22 (×3): qty 0.4

## 2023-02-22 MED ORDER — ONDANSETRON HCL 4 MG PO TABS: 4.0000 mg | ORAL_TABLET | Freq: Four times a day (QID) | ORAL | Status: DC | PRN

## 2023-02-22 MED ORDER — ONDANSETRON HCL 4 MG/2ML IJ SOLN: 4.0000 mg | Freq: Four times a day (QID) | INTRAMUSCULAR | Status: DC | PRN

## 2023-02-22 MED ORDER — ALBUTEROL SULFATE (2.5 MG/3ML) 0.083% IN NEBU: 2.5000 mg | INHALATION_SOLUTION | Freq: Four times a day (QID) | RESPIRATORY_TRACT | Status: DC | PRN

## 2023-02-22 MED ORDER — DICLOFENAC SODIUM 1 % EX GEL
2.0000 g | Freq: Four times a day (QID) | CUTANEOUS | Status: DC
  Administered 2023-02-22 – 2023-02-23 (×3): 2 g via TOPICAL
  Filled 2023-02-22: qty 100

## 2023-02-22 NOTE — ED Provider Notes (Signed)
[facility] [facility] AT [facility] Provider Note  CSN: [id number] Arrival date & time: 02/21/23 2201  Chief Complaint(s) Weakness, Shortness of Breath, and Nausea  HPI Darren Alexander is a 62 y.o. male with a past medical history listed below including paroxysmal A-fib on metoprolol and baby aspirin (no anticoagulation due to prior GI ulcers) here for 1 day of generalized fatigue.  Symptoms began midday with shortness of breath and dyspnea on exertion.  Patient reports that he has been having lower extremity edema for over a year.  He is not on any diuretics.  He reports being told to use compression stockings which he does not use.  Reports that he has been having orthopnea for the past several days.  No fevers or chills.  No coughing or congestion.  He denies any associated chest pain.  Reports that this afternoon he also felt abdominal bloating which improved after belching and dry heaving.  He denies any diarrhea.   The history is provided by the patient.    Past Medical History Past Medical History:  Diagnosis Date   Erectile dysfunction 05/10/2016   Gastric ulcer    Hemochromatosis 02/15/2016   HTN (hypertension)    Iron deficiency anemia 04/06/2016   Obesity    Onychomycosis 04/23/2014   OSA (obstructive sleep apnea) 02/15/2016   PAF (paroxysmal atrial fibrillation) (HCC)    Prediabetes 05/08/2016   Patient Active Problem List   Diagnosis Date Noted   Cellulitis and abscess of left lower extremity 02/22/2023   Abnormal EKG 08/18/2017   Dizziness 06/21/2017   Obesity 06/21/2017   Reduced libido 06/21/2017   Urinary incontinence 06/21/2017   Erectile dysfunction 05/10/2016   Prediabetes 05/08/2016   Iron deficiency anemia 04/06/2016   Polyuria 04/04/2016   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 Authorizing Provider  aspirin 81 MG EC tablet Take 1 tablet (81 mg total) by mouth daily. Swallow whole. 02/22/13   Montez Morita, PA-C  metoprolol succinate (TOPROL-XL) 50 MG 24 hr tablet Take by mouth. 08/31/17   [provider]  omeprazole (PRILOSEC) 40 MG capsule Take by mouth. 06/23/17   [provider]  sildenafil (REVATIO) 20 MG tablet Take by mouth. 05/10/16   [provider]  zolpidem (AMBIEN) 5 MG tablet Take 5 mg by mouth at bedtime as needed for sleep.    [provider]  Allergies Patient has no known allergies.  Review of Systems Review of Systems As noted in HPI  Physical Exam Vital Signs  I have reviewed the triage vital signs BP (!) 154/102   Pulse 97   Temp 98.9 F (37.2 C) (Oral)   Resp 17   SpO2 96%   Physical Exam Vitals reviewed.  Constitutional:      General: He is not in acute distress.    Appearance: He is well-developed. He is not diaphoretic.  HENT:     Head: Normocephalic and atraumatic.     Nose: Nose normal.  Eyes:     General: No scleral icterus.       Right eye: No discharge.        Left eye: No discharge.     Conjunctiva/sclera: Conjunctivae normal.     Pupils: Pupils are equal, round, and reactive to light.  Cardiovascular:     Rate and Rhythm: Tachycardia present. Rhythm irregularly irregular.     Heart sounds: No murmur heard.    No friction rub. No gallop.  Pulmonary:     Effort: Pulmonary effort is normal. No respiratory distress.     Breath sounds: Normal breath sounds. No stridor. No rales.  Abdominal:     General:  There is no distension.     Palpations: Abdomen is soft.     Tenderness: There is no abdominal tenderness.  Musculoskeletal:     Cervical back: Normal range of motion and neck supple.     Right lower leg: 1+ Pitting Edema present.     Left lower leg: Tenderness present. 1+ Pitting Edema present.     Comments: LLE with mild erythema posteriorly with TTP to light touch.   Skin:    General: Skin is warm and dry.     Findings: No erythema or rash.  Neurological:     Mental Status: He is alert and oriented to person, place, and time.     ED Results and Treatments Labs (all labs ordered are listed, but only abnormal results are displayed) Labs Reviewed  BASIC METABOLIC PANEL - Abnormal; Notable for the following components:      Result Value   Sodium 134 (*)    CO2 21 (*)    Glucose, Bld 107 (*)    Calcium 8.6 (*)    All other components within normal limits  CBC - Abnormal; Notable for the following components:   WBC 16.7 (*)    Hemoglobin 17.4 (*)    HCT 52.2 (*)    All other components within normal limits  URINALYSIS, ROUTINE W REFLEX MICROSCOPIC - Abnormal; Notable for the following components:   Ketones, ur 5 (*)    All other components within normal limits  BRAIN NATRIURETIC PEPTIDE - Abnormal; Notable for the following components:   B Natriuretic Peptide 167.7 (*)    All other components within normal limits  HEPATIC FUNCTION PANEL - Abnormal; Notable for the following components:   Albumin 3.4 (*)    AST 54 (*)    ALT 48 (*)    Total Bilirubin 1.8 (*)    Bilirubin, Direct 0.5 (*)    Indirect Bilirubin 1.3 (*)    All other components within normal limits  CBG MONITORING, ED - Abnormal; Notable for the following components:   Glucose-Capillary 117 (*)    All other components within normal limits  MAGNESIUM  D-DIMER, QUANTITATIVE  TROPONIN I (HIGH SENSITIVITY)  TROPONIN I (HIGH SENSITIVITY)  EKG  EKG Interpretation  Date/Time:  Tuesday February 21 2023 22:32:54 EDT Ventricular Rate:  108 PR Interval:    QRS Duration: 86 QT Interval:  310 QTC Calculation: 415 R Axis:   45 Text Interpretation: ** Poor data quality, interpretation may be adversely affected Atrial fibrillation with rapid ventricular response Septal infarct , age undetermined ST & T wave abnormality, consider anterolateral ischemia Abnormal ECG When compared with ECG of 30-Aug-2017 12:57, PREVIOUS ECG IS PRESENT Confirmed by Drema Pry [phone]) on 02/22/2023 2:15:57 AM       Radiology DG ABD ACUTE 2+V W 1V CHEST  Result Date: 02/22/2023 CLINICAL DATA:  Weakness and vomiting. EXAM: DG ABDOMEN ACUTE WITH 1 VIEW CHEST COMPARISON:  November 11, 2019 FINDINGS: There is no evidence of dilated bowel loops or free intraperitoneal air. No radiopaque calculi or other significant radiographic abnormality is seen. Heart size and mediastinal contours are within normal limits. Both lungs are clear. IMPRESSION: Negative abdominal radiographs.  No acute cardiopulmonary disease. Electronically Signed   By: Aram Candela M.D.   On: 02/22/2023 03:02    Medications Ordered in ED Medications  ceFAZolin (ANCEF) IVPB 2g/100 mL premix (0 g Intravenous Stopped 02/22/23 0628)   Procedures .1-3 Lead EKG Interpretation  Performed by: Nira Conn, MD Authorized by: Nira Conn, MD     Interpretation: abnormal     ECG rate:  90-110s   ECG rate assessment: tachycardic     Rhythm: atrial fibrillation     Ectopy: none     Conduction: normal     (including critical care time) Medical Decision Making / ED Course   Medical Decision Making Amount and/or Complexity of Data Reviewed Labs: ordered. Decision-making details documented in ED Course. Radiology: ordered and independent interpretation performed. Decision-making details documented in ED Course. ECG/medicine  tests: ordered and independent interpretation performed. Decision-making details documented in ED Course.  Risk Prescription drug management.    Patient presents with generalized fatigue. Appears to be volume overloaded on exam. Also has evidence of cellulitis in the left lower extremity.  EKG with A-fib RVR and new ST and T wave changes when compared to EKGs from 5 years ago. Patient is denying any focal deficits concerning for CVA at this time. Given his constellation of symptoms, more concerned with new onset heart failure likely related to atrial fibrillation and hypertension. Will like to rule out ACS as a cause of his shortness of breath. 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 Will also assess for electrolyte derangements, severe anemia, renal insufficiency.  CBC with leukocytosis.  Elevated hemoglobin and hematocrit 6 wishes for intravascular hemoconcentration.  This appears to be baseline for patient. BMP without significant electrolyte derangements or renal sufficiency. LFTs with mild transaminitis Troponin negative BNP less than 200 Dimer negative  Acute abdominal series with chest obtain given the patient's reported bloating history. Chest without evidence of pneumonia, pneumothorax, pulmonary edema or pleural effusions.  No evidence concerning for bowel obstruction.  Patient was started on IV Ancef for cellulitis.  HEART score of 4. Will admit for ACS rule out given the new EKG changes. Patient may also be having exertional tachycardia with more persistent A-fib contributing to his symptoms. Consulted medicine for admission spoke with Dr. Arville Care who agreed to admit patient.       Final Clinical Impression(s) / ED Diagnoses Final diagnoses:  DOE (dyspnea on exertion)  Atrial fibrillation, unspecified type (HCC)  Left leg cellulitis    This chart  was dictated using voice recognition software.  Despite best efforts to proofread,  errors can occur which  can change the documentation meaning.    Nira Conn, MD 02/22/23 [phone]

## 2023-02-22 NOTE — Progress Notes (Signed)
Patient brought to floor by ER nurse, no report, SBAR or H&P in chart. Ask nurse if she wanted to give bedside report she stated not my patient. ER called and Velva Harman gave verbal after patient on floor.  Morrissa Shein, Kae Heller, RN

## 2023-02-22 NOTE — Progress Notes (Signed)
Echocardiogram 2D Echocardiogram has been performed.  Lucendia Herrlich 02/22/2023, 4:22 PM

## 2023-02-22 NOTE — Plan of Care (Signed)
  Problem: Clinical Measurements: Goal: Ability to avoid or minimize complications of infection will improve Outcome: Progressing   

## 2023-02-22 NOTE — H&P (Signed)
History and Physical    Patient: Darren Alexander VQQ:[id number] DOB: 02-21-1961 DOA: 02/21/2023 DOS: the patient was seen and examined on 02/22/2023 PCP: Olive Bass, MD  Patient coming from: Home  Chief Complaint:  Chief Complaint  Patient presents with   Weakness   Shortness of Breath   Nausea   HPI: Darren Alexander is a 62 y.o. male with medical history significant of hypertension, paroxysmal atrial fibrillation on aspirin, hemochromatosis, and gastric ulcers who presented with complaints of weakness starting yesterday.  He reports that he was not even able to lift a 40 pound bag of dog food which was unusual.  He reported associated symptoms of having nausea with dry heaves, chronic bilateral knee pain, and all wanted to do a sleep.  He chronically has leg swelling and reports that his legs intermittently blister and popped.  Yesterday he had also noted that on there was some increased redness and the left leg and it was irritated.    In the emergency department patient was noted to be afebrile with tachycardia, blood pressure elevated up to 177/102, and vital signs maintained.  Labs significant for WBC 16.7, hemoglobin 17.4, sodium 134, AST 54, ALT 48, total bilirubin 1.8, indirect bilirubin 1.3, D-dimer 0.49, BNP 167, and high-sensitivity troponin 7.  Acute abdominal series showed no acute abnormality.  Urinalysis was negative for any signs for infection.  Patient has been given cephalexin IV.  TRH called to admit.    Review of Systems: As mentioned in the history of present illness. All other systems reviewed and are negative. Past Medical History:  Diagnosis Date   Erectile dysfunction 05/10/2016   Gastric ulcer    Hemochromatosis 02/15/2016   HTN (hypertension)    Iron deficiency anemia 04/06/2016   Obesity    Onychomycosis 04/23/2014   OSA (obstructive sleep apnea) 02/15/2016   PAF (paroxysmal atrial fibrillation) (HCC)    Prediabetes 05/08/2016   Past Surgical History:   Procedure Laterality Date   I & D EXTREMITY Bilateral 02/20/2013   Procedure: IRRIGATION AND DEBRIDEMENT bilateral lower legs ;  Surgeon: Budd Palmer, MD;  Location: [facility] OR;  Service: Orthopedics;  Laterality: Bilateral;   ORIF Left humerus  2012   Social History:  reports that he has never smoked. He has never used smokeless tobacco. He reports that he does not drink alcohol and does not use drugs.  No Known Allergies  History reviewed. No pertinent family history.  Prior to Admission medications   Medication Sig Start Date End Date Taking? Authorizing Provider  aspirin 81 MG EC tablet Take 1 tablet (81 mg total) by mouth daily. Swallow whole. 02/22/13   Montez Morita, PA-C  metoprolol succinate (TOPROL-XL) 50 MG 24 hr tablet Take by mouth. 08/31/17   [provider]  omeprazole (PRILOSEC) 40 MG capsule Take by mouth. 06/23/17   [provider]  sildenafil (REVATIO) 20 MG tablet Take by mouth. 05/10/16   [provider]  zolpidem (AMBIEN) 5 MG tablet Take 5 mg by mouth at bedtime as needed for sleep.    [provider]    Physical Exam: Vitals:   02/22/23 0309 02/22/23 0545 02/22/23 0652 02/22/23 0654  BP: (!) 135/104 (!) 177/102  (!) 154/102  Pulse: 96 97  97  Resp: 18 17    Temp: 99.2 F (37.3 C)  98.9 F (37.2 C)   TempSrc: Oral  Oral   SpO2: 99% 97%  96%   Constitutional: Older adult male currently in  no acute distress Eyes: PERRL, lids and conjunctivae normal ENMT: Mucous membranes are moist.  Normal dentition.  Neck: normal, supple   Respiratory: clear to auscultation bilaterally, no wheezing, no crackles. Normal respiratory effort. No accessory muscle use.  Cardiovascular: Regular rate and rhythm, no murmurs / rubs / gallops.  At least +1 pitting bilateral lower extremity edema. 2+ pedal pulses. No carotid bruits.  Abdomen: no tenderness, no masses palpated. No hepatosplenomegaly. Bowel sounds positive.  Musculoskeletal: no clubbing /  cyanosis. No joint deformity upper and lower extremities. Good ROM, no contractures. Normal muscle tone.  Skin: Open superficial wounds noted of the left leg with erythema surrounding as seen below Neurologic: CN 2-12 grossly intact. Sensation intact, DTR normal. Strength 5/5 in all 4.  Psychiatric: Normal judgment and insight. Alert and oriented x 3. Normal mood.   Data Reviewed:  EKG revealed atrial fibrillation at 99 bpm.  Reviewed labs, imaging, and pertinent records as documented in this note. Assessment and Plan: Sepsis secondary to cellulitis Acute.  Patient presented with tachycardia and white blood cell count elevated up to 16.7.  He was noted to have erythema of the left lower extremity concerning for cellulitis.  D-dimer was reassuring at 0.49 for which DVT was thought less likely.  Lactic acid and blood cultures and not initially been obtained.  Patient had been started on empiric antibiotics of cefazolin. -Admit to medical telemetry bed. -Check single blood culture as patient had already received antibiotics. -Add-on lactic acid, ESR, CRP if able off prior labs -Continue cefazolin -Tylenol as needed for fever/pain  Hypertensive urgency Acute.  Blood pressure elevated up to 177/102.  Home blood pressure medication regimen includes metoprolol succinate 50 mg daily. -Continue metoprolol -Hydralazine IV as needed for elevated blood pressures  Possible acute heart failure exacerbation Patient reported having complaints of shortness of breath yesterday.  BNP elevated at 167.7.  Patient with at least 2+ pitting edema noted of the bilateral lower extremities. -Strict I&Os and daily weights -Check echocardiogram -Lasix 40 mg IV twice daily x 2 doses.  Reassess in a.m. and determine need of further IV diuresis. -Consider need for formal consult to cardiology based upon echocardiogram  Paroxysmal atrial fibrillation Patient appears to be in atrial fibrillation but rate controlled.   Notes being on aspirin and not on anticoagulation due to prior history of gastric ulcers. -Continue aspirin -Continue metoprolol for rate control -Goal potassium at least 4 and magnesium at least 2  Polycythemia Hemoglobin 17.4 which appears around patient's previous baseline. -Recheck CBC tomorrow morning  Elevated liver enzymes Acute.  Labs significant for AST 54, ALT 48, total bilirubin 1.8, indirect bilirubin 1.3Patient denies any reports of alcohol use.  History of gastric ulcers -Continue pharmacy substitution of Protonix   Obesity BMI 38.27 kg/m  DVT prophylaxis: Lovenox Advance Care Planning:   Code Status: Full Code    Consults: None  Family Communication: No family requested to be updated at this time  Severity of Illness: The appropriate patient status for this patient is INPATIENT. Inpatient status is judged to be reasonable and necessary in order to provide the required intensity of service to ensure the patient's safety. The patient's presenting symptoms, physical exam findings, and initial radiographic and laboratory data in the context of their chronic comorbidities is felt to place them at high risk for further clinical deterioration. Furthermore, it is not anticipated that the patient will be medically stable for discharge from the hospital within 2 midnights of admission.   * I  certify that at the point of admission it is my clinical judgment that the patient will require inpatient hospital care spanning beyond 2 midnights from the point of admission due to high intensity of service, high risk for further deterioration and high frequency of surveillance required.*  Author: Clydie Braun, MD 02/22/2023 7:35 AM  For on call review [email or web address].

## 2023-02-23 DIAGNOSIS — I160 Hypertensive urgency: Secondary | ICD-10-CM | POA: Diagnosis not present

## 2023-02-23 DIAGNOSIS — I509 Heart failure, unspecified: Secondary | ICD-10-CM | POA: Diagnosis not present

## 2023-02-23 DIAGNOSIS — Z8711 Personal history of peptic ulcer disease: Secondary | ICD-10-CM | POA: Diagnosis not present

## 2023-02-23 DIAGNOSIS — L03116 Cellulitis of left lower limb: Secondary | ICD-10-CM | POA: Diagnosis not present

## 2023-02-23 LAB — COMPREHENSIVE METABOLIC PANEL
ALT: 35 U/L (ref 0–44)
AST: 47 U/L — ABNORMAL HIGH (ref 15–41)
Albumin: 3.1 g/dL — ABNORMAL LOW (ref 3.5–5.0)
Alkaline Phosphatase: 98 U/L (ref 38–126)
Anion gap: 9 (ref 5–15)
BUN: 15 mg/dL (ref 8–23)
CO2: 23 mmol/L (ref 22–32)
Calcium: 8.5 mg/dL — ABNORMAL LOW (ref 8.9–10.3)
Chloride: 103 mmol/L (ref 98–111)
Creatinine, Ser: 0.77 mg/dL (ref 0.61–1.24)
GFR, Estimated: >60 mL/min (ref >60)
Glucose, Bld: 113 mg/dL — ABNORMAL HIGH (ref 70–99)
Potassium: 3.8 mmol/L (ref 3.5–5.1)
Sodium: 135 mmol/L (ref 135–145)
Total Bilirubin: 1.1 mg/dL (ref 0.3–1.2)
Total Protein: 6.8 g/dL (ref 6.5–8.1)

## 2023-02-23 LAB — CBC
HCT: 54.5 % — ABNORMAL HIGH (ref 39.0–52.0)
Hemoglobin: 18 g/dL — ABNORMAL HIGH (ref 13.0–17.0)
MCH: 29.9 pg (ref 26.0–34.0)
MCHC: 33 g/dL (ref 30.0–36.0)
MCV: 90.5 fL (ref 80.0–100.0)
Platelets: 133 10*3/uL — ABNORMAL LOW (ref 150–400)
RBC: 6.02 MIL/uL — ABNORMAL HIGH (ref 4.22–5.81)
RDW: 13.6 % (ref 11.5–15.5)
WBC: 12 10*3/uL — ABNORMAL HIGH (ref 4.0–10.5)
nRBC: 0 % (ref 0.0–0.2)

## 2023-02-23 LAB — CULTURE, BLOOD (SINGLE)

## 2023-02-23 MED ORDER — FUROSEMIDE 40 MG PO TABS
40.0000 mg | ORAL_TABLET | Freq: Every day | ORAL | Status: DC
  Administered 2023-02-23 – 2023-02-24 (×2): 40 mg via ORAL
  Filled 2023-02-23 (×2): qty 1

## 2023-02-23 NOTE — Progress Notes (Signed)
PROGRESS NOTE        PATIENT DETAILS Name: Darren Alexander Age: 62 y.o. Sex: male Date of Birth: [birth year] Admit Date: 02/21/2023 Admitting Physician Clydie Braun, MD VWU:[id number], Doris Cheadle, MD  Brief Summary: Patient is a 62 y.o.  male with history of HTN, PAF, hemochromatosis, gastric ulcers-who presented with weakness-found to have sepsis secondary to LLE cellulitis.  Significant events: 6/25>> admit to [facility]  Significant studies: 6/26>> x-ray chest/abdomen: No acute abnormalities. 6/26>> echo: EF 65-70%  Significant microbiology data: 6/26>> blood culture: Negative  Procedures: None  Consults: None  Subjective: Lying comfortably in bed-denies any chest pain or shortness of breath.  LLE appears the same-not any worse.  He otherwise feels a bit better.  Objective: Vitals: Blood pressure (!) 137/99, pulse 86, temperature 98.7 F (37.1 C), temperature source Oral, resp. rate 18, height 6\' 2"  (1.88 m), weight 135.2 kg, SpO2 94 %.   Exam: Gen Exam:Alert awake-not in any distress HEENT:atraumatic, normocephalic Chest: B/L clear to auscultation anteriorly CVS:S1S2 regular Abdomen:soft non tender, non distended Extremities: Patchy erythema-LLE-no purulent discharge observed-essentially similar to the picture on admission. Neurology: Non focal Skin: no rash  Pertinent Labs/Radiology:    Latest Ref Rng & Units 02/23/2023    5:35 AM 02/21/2023   10:36 PM 02/26/2017   12:50 PM  CBC  WBC 4.0 - 10.5 K/uL 12.0  16.7    Hemoglobin 13.0 - 17.0 g/dL 14.7  82.9  56.2   Hematocrit 39.0 - 52.0 % 54.5  52.2  53.0   Platelets 150 - 400 K/uL 133  155      Lab Results  Component Value Date   NA 135 02/23/2023   K 3.8 02/23/2023   CL 103 02/23/2023   CO2 23 02/23/2023      Assessment/Plan: Sepsis secondary to LLE (POA) Improved-leukocytosis downtrending Exam appears same as picture taken on admission Cultures negative Continue  antibiotics Elevate LLE Doppler LLE  Acute on chronic HFpEF exacerbation Volume status better after getting IV Lasix Starting oral Lasix from today Monitor on telemetry  Hypertensive urgency Blood pressure much better this morning Continue metoprolol Lasix as above  Persistent atrial fibrillation Continue Toprol Continue aspirin CHADS2 Vascor of 1-prior history of GI bleed/gastric ulcer-not on anticoagulation.  History of gastric ulcer  PPI  Obesity: Estimated body mass index is 38.27 kg/m as calculated from the following:   Height as of this encounter: 6\' 2"  (1.88 m).   Weight as of this encounter: 135.2 kg.   Code status:   Code Status: Full Code   DVT Prophylaxis: enoxaparin (LOVENOX) injection 40 mg Start: 02/22/23 1000   Family Communication: None at bedside   Disposition Plan: Status is: Inpatient Remains inpatient appropriate because: Severity of illness   Planned Discharge Destination:Home   Diet: Diet Order             Diet Heart Room service appropriate? Yes; Fluid consistency: Thin  Diet effective now                     Antimicrobial agents: Anti-infectives (From admission, onward)    Start     Dose/Rate Route Frequency Ordered Stop   02/22/23 1300  ceFAZolin (ANCEF) IVPB 2g/100 mL premix        2 g 200 mL/hr over 30 Minutes Intravenous Every 8 hours 02/22/23 0824 03/01/23  1259   02/22/23 0430  ceFAZolin (ANCEF) IVPB 2g/100 mL premix        2 g 200 mL/hr over 30 Minutes Intravenous  Once 02/22/23 0415 02/22/23 1610        MEDICATIONS: Scheduled Meds:  aspirin EC  81 mg Oral Daily   diclofenac Sodium  2 g Topical QID   enoxaparin (LOVENOX) injection  40 mg Subcutaneous Daily   metoprolol succinate  50 mg Oral Daily   pantoprazole  40 mg Oral Daily   sodium chloride flush  3 mL Intravenous Q12H   Continuous Infusions:   ceFAZolin (ANCEF) IV 2 g (02/23/23 0427)   PRN Meds:.acetaminophen **OR** acetaminophen, albuterol,  hydrALAZINE, ondansetron **OR** ondansetron (ZOFRAN) IV   I have personally reviewed following labs and imaging studies  LABORATORY DATA: CBC: Recent Labs  Lab 02/21/23 2236 02/23/23 0535  WBC 16.7* 12.0*  HGB 17.4* 18.0*  HCT 52.2* 54.5*  MCV 90.2 90.5  PLT 155 133*    Basic Metabolic Panel: Recent Labs  Lab 02/21/23 2236 02/22/23 0322 02/23/23 0535  NA 134*  --  135  K 4.2  --  3.8  CL 104  --  103  CO2 21*  --  23  GLUCOSE 107*  --  113*  BUN 15  --  15  CREATININE 0.77  --  0.77  CALCIUM 8.6*  --  8.5*  MG  --  1.9  --     GFR: Estimated Creatinine Clearance: 138.2 mL/min (by C-G formula based on SCr of 0.77 mg/dL).  Liver Function Tests: Recent Labs  Lab 02/22/23 0322 02/23/23 0535  AST 54* 47*  ALT 48* 35  ALKPHOS 104 98  BILITOT 1.8* 1.1  PROT 7.2 6.8  ALBUMIN 3.4* 3.1*   No results for input(s): "LIPASE", "AMYLASE" in the last 168 hours. No results for input(s): "AMMONIA" in the last 168 hours.  Coagulation Profile: No results for input(s): "INR", "PROTIME" in the last 168 hours.  Cardiac Enzymes: No results for input(s): "CKTOTAL", "CKMB", "CKMBINDEX", "TROPONINI" in the last 168 hours.  BNP (last 3 results) No results for input(s): "PROBNP" in the last 8760 hours.  Lipid Profile: No results for input(s): "CHOL", "HDL", "LDLCALC", "TRIG", "CHOLHDL", "LDLDIRECT" in the last 72 hours.  Thyroid Function Tests: No results for input(s): "TSH", "T4TOTAL", "FREET4", "T3FREE", "THYROIDAB" in the last 72 hours.  Anemia Panel: No results for input(s): "VITAMINB12", "FOLATE", "FERRITIN", "TIBC", "IRON", "RETICCTPCT" in the last 72 hours.  Urine analysis:    Component Value Date/Time   COLORURINE YELLOW 02/22/2023 0546   APPEARANCEUR CLEAR 02/22/2023 0546   LABSPEC 1.017 02/22/2023 0546   PHURINE 5.0 02/22/2023 0546   GLUCOSEU NEGATIVE 02/22/2023 0546   HGBUR NEGATIVE 02/22/2023 0546   BILIRUBINUR NEGATIVE 02/22/2023 0546   KETONESUR 5 (A)  02/22/2023 0546   PROTEINUR NEGATIVE 02/22/2023 0546   NITRITE NEGATIVE 02/22/2023 0546   LEUKOCYTESUR NEGATIVE 02/22/2023 0546    Sepsis Labs: Lactic Acid, Venous    Component Value Date/Time   LATICACIDVEN 1.3 02/22/2023 1400    MICROBIOLOGY: Recent Results (from the past 240 hour(s))  Culture, blood (single) w Reflex to ID Panel     Status: None (Preliminary result)   Collection Time: 02/22/23  2:01 PM   Specimen: BLOOD RIGHT ARM  Result Value Ref Range Status   Specimen Description BLOOD RIGHT ARM  Final   Special Requests   Final    BOTTLES DRAWN AEROBIC AND ANAEROBIC Blood Culture adequate volume   Culture  Final    NO GROWTH < 24 HOURS Performed at [facility], [address] [place], [place] [address]    Report Status PENDING  Incomplete    RADIOLOGY STUDIES/RESULTS: ECHOCARDIOGRAM COMPLETE  Result Date: 02/22/2023    ECHOCARDIOGRAM REPORT   Patient Name:   EDWING FIGLEY Date of Exam: 02/22/2023 Medical Rec #:  [id number]         Height:       74.0 in Accession #:    [id number]        Weight:       298.1 lb Date of Birth:  Jan 17, 1961         BSA:          2.575 m Patient Age:    62 years          BP:           128/89 mmHg Patient Gender: M                 HR:           72 bpm. Exam Location:  Inpatient Procedure: 2D Echo, Cardiac Doppler and Color Doppler Indications:    Atrial Fibrillation I48.91  History:        Patient has no prior history of Echocardiogram examinations.                 CHF, Arrythmias:Atrial Fibrillation; Risk Factors:Sleep Apnea                 and Hypertension.  Sonographer:    Lucendia Herrlich Referring Phys: [id number] RONDELL A SMITH IMPRESSIONS  1. Left ventricular ejection fraction, by estimation, is 65 to 70%. The left ventricle has normal function. The left ventricle has no regional wall motion abnormalities. Left ventricular diastolic function could not be evaluated.  2. Right ventricular systolic function is normal. The right ventricular  size is normal. Tricuspid regurgitation signal is inadequate for assessing PA pressure.  3. The mitral valve is grossly normal. Trivial mitral valve regurgitation. No evidence of mitral stenosis.  4. The aortic valve is tricuspid. Aortic valve regurgitation is not visualized. No aortic stenosis is present.  5. The inferior vena cava is dilated in size with >50% respiratory variability, suggesting right atrial pressure of 8 mmHg. FINDINGS  Left Ventricle: Left ventricular ejection fraction, by estimation, is 65 to 70%. The left ventricle has normal function. The left ventricle has no regional wall motion abnormalities. The left ventricular internal cavity size was normal in size. There is  no left ventricular hypertrophy. Left ventricular diastolic function could not be evaluated due to atrial fibrillation. Left ventricular diastolic function could not be evaluated. Right Ventricle: The right ventricular size is normal. No increase in right ventricular wall thickness. Right ventricular systolic function is normal. Tricuspid regurgitation signal is inadequate for assessing PA pressure. Left Atrium: Left atrial size was normal in size. Right Atrium: Right atrial size was normal in size. Pericardium: Trivial pericardial effusion is present. Mitral Valve: The mitral valve is grossly normal. Trivial mitral valve regurgitation. No evidence of mitral valve stenosis. Tricuspid Valve: The tricuspid valve is grossly normal. Tricuspid valve regurgitation is trivial. No evidence of tricuspid stenosis. Aortic Valve: The aortic valve is tricuspid. Aortic valve regurgitation is not visualized. No aortic stenosis is present. Aortic valve peak gradient measures 6.8 mmHg. Pulmonic Valve: The pulmonic valve was grossly normal. Pulmonic valve regurgitation is not visualized. No evidence of pulmonic stenosis. Aorta: The aortic root and ascending  aorta are structurally normal, with no evidence of dilitation. Venous: The inferior vena cava  is dilated in size with greater than 50% respiratory variability, suggesting right atrial pressure of 8 mmHg. IAS/Shunts: The atrial septum is grossly normal.  LEFT VENTRICLE PLAX 2D LVIDd:         4.35 cm   Diastology LVIDs:         3.10 cm   LV e' medial:    12.10 cm/s LV PW:         1.05 cm   LV E/e' medial:  8.8 LV IVS:        1.30 cm   LV e' lateral:   17.30 cm/s LVOT diam:     2.30 cm   LV E/e' lateral: 6.1 LV SV:         62 LV SV Index:   24 LVOT Area:     4.15 cm                           3D Volume EF:                          3D EF:        57 %                          LV EDV:       105 ml                          LV ESV:       46 ml                          LV SV:        60 ml RIGHT VENTRICLE             IVC RV S prime:     15.50 cm/s  IVC diam: 2.30 cm TAPSE (M-mode): 2.2 cm LEFT ATRIUM             Index        RIGHT ATRIUM           Index LA diam:        4.85 cm 1.88 cm/m   RA Area:     21.50 cm LA Vol (A2C):   66.1 ml 25.65 ml/m  RA Volume:   56.50 ml  21.94 ml/m LA Vol (A4C):   73.3 ml 28.46 ml/m LA Biplane Vol: 76.2 ml 29.59 ml/m  AORTIC VALVE AV Area (Vmax): 3.29 cm AV Vmax:        130.00 cm/s AV Peak Grad:   6.8 mmHg LVOT Vmax:      103.00 cm/s LVOT Vmean:     62.633 cm/s LVOT VTI:       0.150 m  AORTA Ao Root diam: 3.50 cm Ao Asc diam:  3.20 cm MITRAL VALVE MV Area (PHT): 5.13 cm     SHUNTS MV Decel Time: 148 msec     Systemic VTI:  0.15 m MV E velocity: 106.00 cm/s  Systemic Diam: 2.30 cm Lennie Odor MD Electronically signed by Lennie Odor MD Signature Date/Time: 02/22/2023/4:36:47 PM    Final    DG ABD ACUTE 2+V W 1V CHEST  Result Date: 02/22/2023 CLINICAL DATA:  Weakness and vomiting. EXAM: DG ABDOMEN ACUTE WITH 1 VIEW  CHEST COMPARISON:  November 11, 2019 FINDINGS: There is no evidence of dilated bowel loops or free intraperitoneal air. No radiopaque calculi or other significant radiographic abnormality is seen. Heart size and mediastinal contours are within normal limits. Both  lungs are clear. IMPRESSION: Negative abdominal radiographs.  No acute cardiopulmonary disease. Electronically Signed   By: Aram Candela M.D.   On: 02/22/2023 03:02     LOS: 1 day   Jeoffrey Massed, MD  [facility]    To contact the attending provider between 7A-7P or the covering provider during after hours 7P-7A, please log into the web site [email or web address] and access using universal [place] password for that web site. If you do not have the password, please call the hospital operator.  02/23/2023, 11:37 AM

## 2023-02-23 NOTE — TOC CM/SW Note (Signed)
Transition of Care [facility]) - Inpatient Brief Assessment   Patient Details  Name: Darren Alexander MRN: [id number] Date of Birth: 05-Sep-1960  [facility]) CM/SW Contact:    Gordy Clement, RN Phone Number: 02/23/2023, 4:08 PM   Clinical Narrative:  Brief assessment completed via chart review. Patient is from home . Daughter is listed as primary contact.Patient is insured through [facility] /[employer] PPO and PCP is established.   TOC will continue to follow patient for any additional discharge needs       Transition of Care Asessment: Insurance and Status: Insurance coverage has been reviewed Patient has primary care physician: Yes (Dough, Doris Cheadle, MD) Home environment has been reviewed: From home Prior level of function:: independent Prior/Current Home Services: No current home services Social Determinants of Health Reivew: SDOH reviewed no interventions necessary Readmission risk has been reviewed: Yes (8%) Transition of care needs: transition of care needs identified, TOC will continue to follow

## 2023-02-24 ENCOUNTER — Inpatient Hospital Stay (HOSPITAL_COMMUNITY): Payer: Commercial Managed Care - PPO

## 2023-02-24 ENCOUNTER — Other Ambulatory Visit (HOSPITAL_COMMUNITY): Payer: Self-pay

## 2023-02-24 DIAGNOSIS — M7989 Other specified soft tissue disorders: Secondary | ICD-10-CM

## 2023-02-24 DIAGNOSIS — L03116 Cellulitis of left lower limb: Secondary | ICD-10-CM | POA: Diagnosis not present

## 2023-02-24 DIAGNOSIS — I160 Hypertensive urgency: Secondary | ICD-10-CM | POA: Diagnosis not present

## 2023-02-24 DIAGNOSIS — D751 Secondary polycythemia: Secondary | ICD-10-CM | POA: Diagnosis not present

## 2023-02-24 DIAGNOSIS — I509 Heart failure, unspecified: Secondary | ICD-10-CM | POA: Diagnosis not present

## 2023-02-24 LAB — CULTURE, BLOOD (SINGLE)

## 2023-02-24 MED ORDER — POTASSIUM CHLORIDE CRYS ER 10 MEQ PO TBCR
10.0000 meq | EXTENDED_RELEASE_TABLET | Freq: Every day | ORAL | 1 refills | Status: AC
  Filled 2023-02-24: qty 30, 30d supply, fill #0

## 2023-02-24 MED ORDER — CEPHALEXIN 500 MG PO CAPS
1000.0000 mg | ORAL_CAPSULE | Freq: Three times a day (TID) | ORAL | 0 refills | Status: AC
  Filled 2023-02-24: qty 30, 5d supply, fill #0

## 2023-02-24 MED ORDER — FUROSEMIDE 40 MG PO TABS
40.0000 mg | ORAL_TABLET | Freq: Every day | ORAL | 1 refills | Status: AC
  Filled 2023-02-24: qty 30, 30d supply, fill #0

## 2023-02-24 NOTE — TOC Transition Note (Signed)
Transition of Care [facility]) - CM/SW Discharge Note   Patient Details  Name: Darren Alexander MRN: [id number] Date of Birth: Dec 09, 1960  Transition of Care [facility]) CM/SW Contact:  Gordy Clement, RN Phone Number: 02/24/2023, 9:35 AM   Clinical Narrative:    Patient will DC to home  Family to transport  No TOC needs identified            Patient Goals and CMS Choice      Discharge Placement                         Discharge Plan and Services Additional resources added to the After Visit Summary for                                       Social Determinants of Health (SDOH) Interventions SDOH Screenings   Tobacco Use: Low Risk  (02/21/2023)     Readmission Risk Interventions     No data to display

## 2023-02-24 NOTE — Plan of Care (Signed)
  Problem: Skin Integrity: Goal: Skin integrity will improve Outcome: Not Progressing   Problem: Clinical Measurements: Goal: Will remain free from infection Outcome: Not Progressing

## 2023-02-24 NOTE — Progress Notes (Signed)
Left lower extremity venous study completed.   Preliminary results relayed to MD and RN.  Please see CV Procedures for preliminary results.  Shailyn Weyandt, RVT  9:09 AM 02/24/23

## 2023-02-24 NOTE — Progress Notes (Signed)
Discharge instructions reviewed with pt and his family member.  Copy of instructions given to pt. [facility] filled scripts for pt and meds were delivered to pt at bedside. Pt verbalized understanding of instructions.  Pt d/c'd via wheelchair with belongings, with family.             Escorted by staff.   Annice Needy, RN SWOT

## 2023-02-24 NOTE — Discharge Summary (Signed)
PATIENT DETAILS Name: Darren Alexander Age: 62 y.o. Sex: male Date of Birth: 1961/08/13 MRN: [id number]. Admitting Physician: Clydie Braun, MD YNW:GNFAO, Doris Cheadle, MD  Admit Date: 02/21/2023 Discharge date: 02/24/2023  Recommendations for Outpatient Follow-up:  Follow up with PCP in 1-2 weeks Please obtain CMP/CBC in one week Follow-up blood cultures until negative  Admitted From:  Home  Disposition: Home   Discharge Condition: good  CODE STATUS:   Code Status: Full Code   Diet recommendation:  Diet Order             Diet - low sodium heart healthy           Diet Heart Room service appropriate? Yes; Fluid consistency: Thin  Diet effective now                    Brief Summary: Patient is a 62 y.o.  male with history of HTN, PAF, hemochromatosis, gastric ulcers-who presented with weakness-found to have sepsis secondary to LLE cellulitis.   Significant events: 6/25>> admit to [facility]   Significant studies: 6/26>> x-ray chest/abdomen: No acute abnormalities. 6/26>> echo: EF 65-70%   Significant microbiology data: 6/26>> blood culture: Negative   Procedures: None   Consults: None  Brief Hospital Course: Sepsis secondary to LLE (POA) Sepsis physiology has resolved-leukocytosis almost normalized Remarkable improvement in erythema/swelling this morning All cultures negative Doppler LLE negative for DVT Was on Ancef-will be transition to Keflex on discharge    Acute on chronic HFpEF exacerbation Volume status/shortness of breath much better after a few doses of IV Lasix-and remains stable on oral Lasix Continue oral Lasix on discharge. Follow-up with PCP/cardiology.    Hypertensive urgency Blood pressure has stabilized with metoprolol/furosemide.     Persistent atrial fibrillation Continue Toprol Continue aspirin CHADS2 Vascore of 1-prior history of GI bleed/gastric ulcer-not on anticoagulation.  Reviewed outpatient cardiology note.   History  of gastric ulcer  PPI   Obesity: Estimated body mass index is 38.27 kg/m as calculated from the following:   Height as of this encounter: 6\' 2"  (1.88 m).   Weight as of this encounter: 135.2 kg.   Discharge Diagnoses:  Principal Problem:   Cellulitis of left lower extremity Active Problems:   Hypertensive urgency   Acute exacerbation of congestive heart failure (HCC)   Paroxysmal atrial fibrillation (HCC)   Polycythemia   Elevated liver enzymes   History of gastric ulcer   Obesity   Discharge Instructions:  Activity:  As tolerated  Discharge Instructions     (HEART FAILURE PATIENTS) Call MD:  Anytime you have any of the following symptoms: 1) 3 pound weight gain in 24 hours or 5 pounds in 1 week 2) shortness of breath, with or without a dry hacking cough 3) swelling in the hands, feet or stomach 4) if you have to sleep on extra pillows at night in order to breathe.   Complete by: As directed    Call MD for:  persistant dizziness or light-headedness   Complete by: As directed    Call MD for:  redness, tenderness, or signs of infection (pain, swelling, redness, odor or green/yellow discharge around incision site)   Complete by: As directed    Diet - low sodium heart healthy   Complete by: As directed    Discharge instructions   Complete by: As directed    Follow with Primary MD  Olive Bass, MD in 1-2 weeks  Please get a complete blood count and chemistry  panel checked by your Primary MD at your next visit, and again as instructed by your Primary MD.  Ask your primary care practitioner to follow-up on blood culture results until final-these are negative at the time of discharge.  Get Medicines reviewed and adjusted: Please take all your medications with you for your next visit with your Primary MD  Laboratory/radiological data: Please request your Primary MD to go over all hospital tests and procedure/radiological results at the follow up, please ask your Primary MD  to get all Hospital records sent to his/her office.  In some cases, they will be blood work, cultures and biopsy results pending at the time of your discharge. Please request that your primary care M.D. follows up on these results.  Also Note the following: If you experience worsening of your admission symptoms, develop shortness of breath, life threatening emergency, suicidal or homicidal thoughts you must seek medical attention immediately by calling 911 or calling your MD immediately  if symptoms less severe.  You must read complete instructions/literature along with all the possible adverse reactions/side effects for all the Medicines you take and that have been prescribed to you. Take any new Medicines after you have completely understood and accpet all the possible adverse reactions/side effects.   Do not drive when taking Pain medications or sleeping medications (Benzodaizepines)  Do not take more than prescribed Pain, Sleep and Anxiety Medications. It is not advisable to combine anxiety,sleep and pain medications without talking with your primary care practitioner  Special Instructions: If you have smoked or chewed Tobacco  in the last 2 yrs please stop smoking, stop any regular Alcohol  and or any Recreational drug use.  Wear Seat belts while driving.  Please note: You were cared for by a hospitalist during your hospital stay. Once you are discharged, your primary care physician will handle any further medical issues. Please note that NO REFILLS for any discharge medications will be authorized once you are discharged, as it is imperative that you return to your primary care physician (or establish a relationship with a primary care physician if you do not have one) for your post hospital discharge needs so that they can reassess your need for medications and monitor your lab values.   Increase activity slowly   Complete by: As directed       Allergies as of 02/24/2023   No Known  Allergies      Medication List     TAKE these medications    aspirin EC 81 MG tablet Take 1 tablet (81 mg total) by mouth daily. Swallow whole.   cephALEXin 500 MG capsule Commonly known as: Keflex Take 2 capsules (1,000 mg total) by mouth 3 (three) times daily for 5 days.   furosemide 40 MG tablet Commonly known as: LASIX Take 1 tablet (40 mg total) by mouth daily. Start taking on: February 25, 2023   meloxicam 15 MG tablet Commonly known as: MOBIC Take 15 mg by mouth daily as needed for pain.   metoprolol succinate 50 MG 24 hr tablet Commonly known as: TOPROL-XL Take 50 mg by mouth daily.   omeprazole 40 MG capsule Commonly known as: PRILOSEC Take 40 mg by mouth daily.   potassium chloride 10 MEQ tablet Commonly known as: KLOR-CON M Take 1 tablet (10 mEq total) by mouth daily.   Voltaren 1 % Gel Generic drug: diclofenac Sodium Apply 1 Application topically as needed (pain).   zolpidem 5 MG tablet Commonly known as: AMBIEN Take 5 mg  by mouth at bedtime as needed for sleep.        Follow-up Information     [facility]. Schedule an appointment as soon as possible for a visit in 1 week(s).   Contact information: [address] [place] [address] [phone]               No Known Allergies   Other Procedures/Studies: VAS [place] LOWER EXTREMITY VENOUS (DVT)  Result Date: 02/24/2023  Lower Venous DVT Study Patient Name:  FIDELIS EVERETTE  Date of Exam:   02/24/2023 Medical Rec #: [id number]          Accession #:    [id number] Date of Birth: [birth year]          Patient Gender: M Patient Age:   85 years Exam Location:  [facility] Procedure:      VAS [place] LOWER EXTREMITY VENOUS (DVT) Referring Phys: Jeoffrey Massed --------------------------------------------------------------------------------  Indications: Swelling.  Comparison Study: No previous study. Performing Technologist: McKayla Maag RVT, [place]  Examination Guidelines: A  complete evaluation includes B-mode imaging, spectral Doppler, color Doppler, and power Doppler as needed of all accessible portions of each vessel. Bilateral testing is considered an integral part of a complete examination. Limited examinations for reoccurring indications may be performed as noted. The reflux portion of the exam is performed with the patient in reverse Trendelenburg.  +-----+---------------+---------+-----------+----------+--------------+ RIGHTCompressibilityPhasicitySpontaneityPropertiesThrombus Aging +-----+---------------+---------+-----------+----------+--------------+ CFV  Full           Yes      Yes                                 +-----+---------------+---------+-----------+----------+--------------+ SFJ  Full                                                        +-----+---------------+---------+-----------+----------+--------------+   +---------+---------------+---------+-----------+----------+--------------+ LEFT     CompressibilityPhasicitySpontaneityPropertiesThrombus Aging +---------+---------------+---------+-----------+----------+--------------+ CFV      Full           Yes      Yes                                 +---------+---------------+---------+-----------+----------+--------------+ SFJ      Full                                                        +---------+---------------+---------+-----------+----------+--------------+ FV Prox  Full                                                        +---------+---------------+---------+-----------+----------+--------------+ FV Mid   Full                                                        +---------+---------------+---------+-----------+----------+--------------+  FV DistalFull                                                        +---------+---------------+---------+-----------+----------+--------------+ PFV      Full                                                         +---------+---------------+---------+-----------+----------+--------------+ POP      Full           Yes      Yes                                 +---------+---------------+---------+-----------+----------+--------------+ PTV      Full                                                        +---------+---------------+---------+-----------+----------+--------------+ PERO     Full                                                        +---------+---------------+---------+-----------+----------+--------------+     Summary: RIGHT: - No evidence of common femoral vein obstruction.  LEFT: - There is no evidence of deep vein thrombosis in the lower extremity.  - No cystic structure found in the popliteal fossa. - Ultrasound characteristics of enlarged lymph nodes noted in the groin.  *See table(s) above for measurements and observations.    Preliminary    ECHOCARDIOGRAM COMPLETE  Result Date: 02/22/2023    ECHOCARDIOGRAM REPORT   Patient Name:   JAEMON POLINSKY Date of Exam: 02/22/2023 Medical Rec #:  [id number]         Height:       74.0 in Accession #:    [id number]        Weight:       298.1 lb Date of Birth:  [birth year]         BSA:          2.575 m Patient Age:    62 years          BP:           128/89 mmHg Patient Gender: M                 HR:           72 bpm. Exam Location:  Inpatient Procedure: 2D Echo, Cardiac Doppler and Color Doppler Indications:    Atrial Fibrillation I48.91  History:        Patient has no prior history of Echocardiogram examinations.                 CHF, Arrythmias:Atrial Fibrillation; Risk Factors:Sleep Apnea                 and Hypertension.  Sonographer:    Lucendia Herrlich Referring Phys: [id number] RONDELL A SMITH IMPRESSIONS  1. Left ventricular ejection fraction, by estimation, is 65 to 70%. The left ventricle has normal function. The left ventricle has no regional wall motion abnormalities. Left ventricular diastolic function could not be evaluated.  2.  Right ventricular systolic function is normal. The right ventricular size is normal. Tricuspid regurgitation signal is inadequate for assessing PA pressure.  3. The mitral valve is grossly normal. Trivial mitral valve regurgitation. No evidence of mitral stenosis.  4. The aortic valve is tricuspid. Aortic valve regurgitation is not visualized. No aortic stenosis is present.  5. The inferior vena cava is dilated in size with >50% respiratory variability, suggesting right atrial pressure of 8 mmHg. FINDINGS  Left Ventricle: Left ventricular ejection fraction, by estimation, is 65 to 70%. The left ventricle has normal function. The left ventricle has no regional wall motion abnormalities. The left ventricular internal cavity size was normal in size. There is  no left ventricular hypertrophy. Left ventricular diastolic function could not be evaluated due to atrial fibrillation. Left ventricular diastolic function could not be evaluated. Right Ventricle: The right ventricular size is normal. No increase in right ventricular wall thickness. Right ventricular systolic function is normal. Tricuspid regurgitation signal is inadequate for assessing PA pressure. Left Atrium: Left atrial size was normal in size. Right Atrium: Right atrial size was normal in size. Pericardium: Trivial pericardial effusion is present. Mitral Valve: The mitral valve is grossly normal. Trivial mitral valve regurgitation. No evidence of mitral valve stenosis. Tricuspid Valve: The tricuspid valve is grossly normal. Tricuspid valve regurgitation is trivial. No evidence of tricuspid stenosis. Aortic Valve: The aortic valve is tricuspid. Aortic valve regurgitation is not visualized. No aortic stenosis is present. Aortic valve peak gradient measures 6.8 mmHg. Pulmonic Valve: The pulmonic valve was grossly normal. Pulmonic valve regurgitation is not visualized. No evidence of pulmonic stenosis. Aorta: The aortic root and ascending aorta are structurally  normal, with no evidence of dilitation. Venous: The inferior vena cava is dilated in size with greater than 50% respiratory variability, suggesting right atrial pressure of 8 mmHg. IAS/Shunts: The atrial septum is grossly normal.  LEFT VENTRICLE PLAX 2D LVIDd:         4.35 cm   Diastology LVIDs:         3.10 cm   LV e' medial:    12.10 cm/s LV PW:         1.05 cm   LV E/e' medial:  8.8 LV IVS:        1.30 cm   LV e' lateral:   17.30 cm/s LVOT diam:     2.30 cm   LV E/e' lateral: 6.1 LV SV:         62 LV SV Index:   24 LVOT Area:     4.15 cm                           3D Volume EF:                          3D EF:        57 %                          LV EDV:       105 ml  LV ESV:       46 ml                          LV SV:        60 ml RIGHT VENTRICLE             IVC RV S prime:     15.50 cm/s  IVC diam: 2.30 cm TAPSE (M-mode): 2.2 cm LEFT ATRIUM             Index        RIGHT ATRIUM           Index LA diam:        4.85 cm 1.88 cm/m   RA Area:     21.50 cm LA Vol (A2C):   66.1 ml 25.65 ml/m  RA Volume:   56.50 ml  21.94 ml/m LA Vol (A4C):   73.3 ml 28.46 ml/m LA Biplane Vol: 76.2 ml 29.59 ml/m  AORTIC VALVE AV Area (Vmax): 3.29 cm AV Vmax:        130.00 cm/s AV Peak Grad:   6.8 mmHg LVOT Vmax:      103.00 cm/s LVOT Vmean:     62.633 cm/s LVOT VTI:       0.150 m  AORTA Ao Root diam: 3.50 cm Ao Asc diam:  3.20 cm MITRAL VALVE MV Area (PHT): 5.13 cm     SHUNTS MV Decel Time: 148 msec     Systemic VTI:  0.15 m MV E velocity: 106.00 cm/s  Systemic Diam: 2.30 cm Lennie Odor MD Electronically signed by Lennie Odor MD Signature Date/Time: 02/22/2023/4:36:47 PM    Final    DG ABD ACUTE 2+V W 1V CHEST  Result Date: 02/22/2023 CLINICAL DATA:  Weakness and vomiting. EXAM: DG ABDOMEN ACUTE WITH 1 VIEW CHEST COMPARISON:  November 11, 2019 FINDINGS: There is no evidence of dilated bowel loops or free intraperitoneal air. No radiopaque calculi or other significant radiographic abnormality is seen.  Heart size and mediastinal contours are within normal limits. Both lungs are clear. IMPRESSION: Negative abdominal radiographs.  No acute cardiopulmonary disease. Electronically Signed   By: Aram Candela M.D.   On: 02/22/2023 03:02     TODAY-DAY OF DISCHARGE:  Subjective:   [place] Rinehimer today has no headache,no chest abdominal pain,no new weakness tingling or numbness, feels much better wants to go home today.  Objective:   Blood pressure (!) 145/109, pulse 97, temperature 98 F (36.7 C), temperature source Oral, resp. rate 18, height 6\' 2"  (1.88 m), weight 135.2 kg, SpO2 94 %.  Intake/Output Summary (Last 24 hours) at 02/24/2023 3664 Last data filed at 02/23/2023 2301 Gross per 24 hour  Intake 286.92 ml  Output 1200 ml  Net -913.08 ml   Filed Weights   02/22/23 0831  Weight: 135.2 kg    Exam: Awake Alert, Oriented *3, No new F.N deficits, Normal affect [place].AT,PERRAL Supple Neck,No JVD, No cervical lymphadenopathy appriciated.  Symmetrical Chest wall movement, Good air movement bilaterally, CTAB RRR,No Gallops,Rubs or new Murmurs, No Parasternal Heave +ve B.Sounds, Abd Soft, Non tender, No organomegaly appriciated, No rebound -guarding or rigidity. No Cyanosis, Clubbing or edema, No new Rash or bruise   PERTINENT RADIOLOGIC STUDIES: VAS [place] LOWER EXTREMITY VENOUS (DVT)  Result Date: 02/24/2023  Lower Venous DVT Study Patient Name:  RAMZEY FEDAK  Date of Exam:   02/24/2023 Medical Rec #: [id number]  Accession #:    [id number] Date of Birth: 1961-01-23          Patient Gender: M Patient Age:   83 years Exam [facility]  [facility] Procedure:      VAS [place] LOWER EXTREMITY VENOUS (DVT) Referring Phys: Jeoffrey Massed --------------------------------------------------------------------------------  Indications: Swelling.  Comparison Study: No previous study. Performing Technologist: McKayla Maag RVT, [place]  Examination Guidelines: A complete evaluation includes  B-mode imaging, spectral Doppler, color Doppler, and power Doppler as needed of all accessible portions of each vessel. Bilateral testing is considered an integral part of a complete examination. Limited examinations for reoccurring indications may be performed as noted. The reflux portion of the exam is performed with the patient in reverse Trendelenburg.  +-----+---------------+---------+-----------+----------+--------------+ RIGHTCompressibilityPhasicitySpontaneityPropertiesThrombus Aging +-----+---------------+---------+-----------+----------+--------------+ CFV  Full           Yes      Yes                                 +-----+---------------+---------+-----------+----------+--------------+ SFJ  Full                                                        +-----+---------------+---------+-----------+----------+--------------+   +---------+---------------+---------+-----------+----------+--------------+ LEFT     CompressibilityPhasicitySpontaneityPropertiesThrombus Aging +---------+---------------+---------+-----------+----------+--------------+ CFV      Full           Yes      Yes                                 +---------+---------------+---------+-----------+----------+--------------+ SFJ      Full                                                        +---------+---------------+---------+-----------+----------+--------------+ FV Prox  Full                                                        +---------+---------------+---------+-----------+----------+--------------+ FV Mid   Full                                                        +---------+---------------+---------+-----------+----------+--------------+ FV DistalFull                                                        +---------+---------------+---------+-----------+----------+--------------+ PFV      Full                                                         +---------+---------------+---------+-----------+----------+--------------+  POP      Full           Yes      Yes                                 +---------+---------------+---------+-----------+----------+--------------+ PTV      Full                                                        +---------+---------------+---------+-----------+----------+--------------+ PERO     Full                                                        +---------+---------------+---------+-----------+----------+--------------+     Summary: RIGHT: - No evidence of common femoral vein obstruction.  LEFT: - There is no evidence of deep vein thrombosis in the lower extremity.  - No cystic structure found in the popliteal fossa. - Ultrasound characteristics of enlarged lymph nodes noted in the groin.  *See table(s) above for measurements and observations.    Preliminary    ECHOCARDIOGRAM COMPLETE  Result Date: 02/22/2023    ECHOCARDIOGRAM REPORT   Patient Name:   MEEKO BOERS Date of Exam: 02/22/2023 Medical Rec #:  [id number]         Height:       74.0 in Accession #:    [id number]        Weight:       298.1 lb Date of Birth:  April 12, 1961         BSA:          2.575 m Patient Age:    62 years          BP:           128/89 mmHg Patient Gender: M                 HR:           72 bpm. Exam Location:  Inpatient Procedure: 2D Echo, Cardiac Doppler and Color Doppler Indications:    Atrial Fibrillation I48.91  History:        Patient has no prior history of Echocardiogram examinations.                 CHF, Arrythmias:Atrial Fibrillation; Risk Factors:Sleep Apnea                 and Hypertension.  Sonographer:    Lucendia Herrlich Referring Phys: [id number] RONDELL A SMITH IMPRESSIONS  1. Left ventricular ejection fraction, by estimation, is 65 to 70%. The left ventricle has normal function. The left ventricle has no regional wall motion abnormalities. Left ventricular diastolic function could not be evaluated.  2. Right  ventricular systolic function is normal. The right ventricular size is normal. Tricuspid regurgitation signal is inadequate for assessing PA pressure.  3. The mitral valve is grossly normal. Trivial mitral valve regurgitation. No evidence of mitral stenosis.  4. The aortic valve is tricuspid. Aortic valve regurgitation is not visualized. No aortic stenosis is present.  5. The inferior vena cava is dilated in size with >50% respiratory variability, suggesting  right atrial pressure of 8 mmHg. FINDINGS  Left Ventricle: Left ventricular ejection fraction, by estimation, is 65 to 70%. The left ventricle has normal function. The left ventricle has no regional wall motion abnormalities. The left ventricular internal cavity size was normal in size. There is  no left ventricular hypertrophy. Left ventricular diastolic function could not be evaluated due to atrial fibrillation. Left ventricular diastolic function could not be evaluated. Right Ventricle: The right ventricular size is normal. No increase in right ventricular wall thickness. Right ventricular systolic function is normal. Tricuspid regurgitation signal is inadequate for assessing PA pressure. Left Atrium: Left atrial size was normal in size. Right Atrium: Right atrial size was normal in size. Pericardium: Trivial pericardial effusion is present. Mitral Valve: The mitral valve is grossly normal. Trivial mitral valve regurgitation. No evidence of mitral valve stenosis. Tricuspid Valve: The tricuspid valve is grossly normal. Tricuspid valve regurgitation is trivial. No evidence of tricuspid stenosis. Aortic Valve: The aortic valve is tricuspid. Aortic valve regurgitation is not visualized. No aortic stenosis is present. Aortic valve peak gradient measures 6.8 mmHg. Pulmonic Valve: The pulmonic valve was grossly normal. Pulmonic valve regurgitation is not visualized. No evidence of pulmonic stenosis. Aorta: The aortic root and ascending aorta are structurally normal,  with no evidence of dilitation. Venous: The inferior vena cava is dilated in size with greater than 50% respiratory variability, suggesting right atrial pressure of 8 mmHg. IAS/Shunts: The atrial septum is grossly normal.  LEFT VENTRICLE PLAX 2D LVIDd:         4.35 cm   Diastology LVIDs:         3.10 cm   LV e' medial:    12.10 cm/s LV PW:         1.05 cm   LV E/e' medial:  8.8 LV IVS:        1.30 cm   LV e' lateral:   17.30 cm/s LVOT diam:     2.30 cm   LV E/e' lateral: 6.1 LV SV:         62 LV SV Index:   24 LVOT Area:     4.15 cm                           3D Volume EF:                          3D EF:        57 %                          LV EDV:       105 ml                          LV ESV:       46 ml                          LV SV:        60 ml RIGHT VENTRICLE             IVC RV S prime:     15.50 cm/s  IVC diam: 2.30 cm TAPSE (M-mode): 2.2 cm LEFT ATRIUM             Index        RIGHT ATRIUM  Index LA diam:        4.85 cm 1.88 cm/m   RA Area:     21.50 cm LA Vol (A2C):   66.1 ml 25.65 ml/m  RA Volume:   56.50 ml  21.94 ml/m LA Vol (A4C):   73.3 ml 28.46 ml/m LA Biplane Vol: 76.2 ml 29.59 ml/m  AORTIC VALVE AV Area (Vmax): 3.29 cm AV Vmax:        130.00 cm/s AV Peak Grad:   6.8 mmHg LVOT Vmax:      103.00 cm/s LVOT Vmean:     62.633 cm/s LVOT VTI:       0.150 m  AORTA Ao Root diam: 3.50 cm Ao Asc diam:  3.20 cm MITRAL VALVE MV Area (PHT): 5.13 cm     SHUNTS MV Decel Time: 148 msec     Systemic VTI:  0.15 m MV E velocity: 106.00 cm/s  Systemic Diam: 2.30 cm Lennie Odor MD Electronically signed by Lennie Odor MD Signature Date/Time: 02/22/2023/4:36:47 PM    Final      PERTINENT LAB RESULTS: CBC: Recent Labs    02/21/23 2236 02/23/23 0535  WBC 16.7* 12.0*  HGB 17.4* 18.0*  HCT 52.2* 54.5*  PLT 155 133*   CMET CMP     Component Value Date/Time   NA 135 02/23/2023 0535   K 3.8 02/23/2023 0535   CL 103 02/23/2023 0535   CO2 23 02/23/2023 0535   GLUCOSE 113 (H) 02/23/2023 0535    BUN 15 02/23/2023 0535   CREATININE 0.77 02/23/2023 0535   CALCIUM 8.5 (L) 02/23/2023 0535   PROT 6.8 02/23/2023 0535   ALBUMIN 3.1 (L) 02/23/2023 0535   AST 47 (H) 02/23/2023 0535   ALT 35 02/23/2023 0535   ALKPHOS 98 02/23/2023 0535   BILITOT 1.1 02/23/2023 0535   GFRNONAA >60 02/23/2023 0535    GFR Estimated Creatinine Clearance: 138.2 mL/min (by C-G formula based on SCr of 0.77 mg/dL). No results for input(s): "LIPASE", "AMYLASE" in the last 72 hours. No results for input(s): "CKTOTAL", "CKMB", "CKMBINDEX", "TROPONINI" in the last 72 hours. Invalid input(s): "POCBNP" Recent Labs    02/22/23 0322  DDIMER 0.49   No results for input(s): "HGBA1C" in the last 72 hours. No results for input(s): "CHOL", "HDL", "LDLCALC", "TRIG", "CHOLHDL", "LDLDIRECT" in the last 72 hours. No results for input(s): "TSH", "T4TOTAL", "T3FREE", "THYROIDAB" in the last 72 hours.  Invalid input(s): "FREET3" No results for input(s): "VITAMINB12", "FOLATE", "FERRITIN", "TIBC", "IRON", "RETICCTPCT" in the last 72 hours. Coags: No results for input(s): "INR" in the last 72 hours.  Invalid input(s): "PT" Microbiology: Recent Results (from the past 240 hour(s))  Culture, blood (single) w Reflex to ID Panel     Status: None (Preliminary result)   Collection Time: 02/22/23  2:01 PM   Specimen: BLOOD RIGHT ARM  Result Value Ref Range Status   Specimen Description BLOOD RIGHT ARM  Final   Special Requests   Final    BOTTLES DRAWN AEROBIC AND ANAEROBIC Blood Culture adequate volume   Culture   Final    NO GROWTH 2 DAYS Performed at [facility], [address], [place], [place] [address]    Report Status PENDING  Incomplete    FURTHER DISCHARGE INSTRUCTIONS:  Get Medicines reviewed and adjusted: Please take all your medications with you for your next visit with your Primary MD  Laboratory/radiological data: Please request your Primary MD to go over all hospital tests and  procedure/radiological results at  the follow up, please ask your Primary MD to get all Hospital records sent to his/her office.  In some cases, they will be blood work, cultures and biopsy results pending at the time of your discharge. Please request that your primary care M.D. goes through all the records of your hospital data and follows up on these results.  Also Note the following: If you experience worsening of your admission symptoms, develop shortness of breath, life threatening emergency, suicidal or homicidal thoughts you must seek medical attention immediately by calling 911 or calling your MD immediately  if symptoms less severe.  You must read complete instructions/literature along with all the possible adverse reactions/side effects for all the Medicines you take and that have been prescribed to you. Take any new Medicines after you have completely understood and accpet all the possible adverse reactions/side effects.   Do not drive when taking Pain medications or sleeping medications (Benzodaizepines)  Do not take more than prescribed Pain, Sleep and Anxiety Medications. It is not advisable to combine anxiety,sleep and pain medications without talking with your primary care practitioner  Special Instructions: If you have smoked or chewed Tobacco  in the last 2 yrs please stop smoking, stop any regular Alcohol  and or any Recreational drug use.  Wear Seat belts while driving.  Please note: You were cared for by a hospitalist during your hospital stay. Once you are discharged, your primary care physician will handle any further medical issues. Please note that NO REFILLS for any discharge medications will be authorized once you are discharged, as it is imperative that you return to your primary care physician (or establish a relationship with a primary care physician if you do not have one) for your post hospital discharge needs so that they can reassess your need for medications and  monitor your lab values.  Total Time spent coordinating discharge including counseling, education and face to face time equals greater than 30 minutes.  Signed: Rick Warnick 02/24/2023 9:22 AM

## 2023-02-27 LAB — CULTURE, BLOOD (SINGLE)
Culture: NO GROWTH
Special Requests: ADEQUATE

## 2023-04-10 ENCOUNTER — Other Ambulatory Visit (HOSPITAL_COMMUNITY): Payer: Self-pay
